# Patient Record
Sex: Female | Born: 1950
Health system: Southern US, Community
[De-identification: ages and names within clinical notes are randomized; demographics above are authoritative.]

## PROBLEM LIST (undated history)

## (undated) DIAGNOSIS — M8580 Other specified disorders of bone density and structure, unspecified site: Secondary | ICD-10-CM

## (undated) DIAGNOSIS — L570 Actinic keratosis: Secondary | ICD-10-CM

## (undated) DIAGNOSIS — K5790 Diverticulosis of intestine, part unspecified, without perforation or abscess without bleeding: Secondary | ICD-10-CM

## (undated) DIAGNOSIS — K649 Unspecified hemorrhoids: Secondary | ICD-10-CM

## (undated) DIAGNOSIS — C4490 Unspecified malignant neoplasm of skin, unspecified: Secondary | ICD-10-CM

## (undated) HISTORY — DX: Diverticulosis of intestine, part unspecified, without perforation or abscess without bleeding: K57.90

## (undated) HISTORY — DX: Actinic keratosis: L57.0

## (undated) HISTORY — DX: Unspecified hemorrhoids: K64.9

## (undated) HISTORY — DX: Other specified disorders of bone density and structure, unspecified site: M85.80

## (undated) HISTORY — DX: Unspecified malignant neoplasm of skin, unspecified: C44.90

---

## 1998-12-30 ENCOUNTER — Ambulatory Visit (HOSPITAL_COMMUNITY): Admission: RE | Admit: 1998-12-30 | Discharge: 1998-12-30 | Payer: Self-pay | Admitting: Family Medicine

## 1999-01-17 ENCOUNTER — Ambulatory Visit (HOSPITAL_COMMUNITY): Admission: RE | Admit: 1999-01-17 | Discharge: 1999-01-17 | Payer: Self-pay | Admitting: Gastroenterology

## 2001-06-12 ENCOUNTER — Other Ambulatory Visit: Admission: RE | Admit: 2001-06-12 | Discharge: 2001-06-12 | Payer: Self-pay | Admitting: Family Medicine

## 2001-08-26 ENCOUNTER — Other Ambulatory Visit: Admission: RE | Admit: 2001-08-26 | Discharge: 2001-08-26 | Payer: Self-pay | Admitting: Family Medicine

## 2004-05-01 ENCOUNTER — Ambulatory Visit (HOSPITAL_COMMUNITY): Admission: RE | Admit: 2004-05-01 | Discharge: 2004-05-01 | Payer: Self-pay | Admitting: Gastroenterology

## 2005-08-05 ENCOUNTER — Emergency Department (HOSPITAL_COMMUNITY): Admission: EM | Admit: 2005-08-05 | Discharge: 2005-08-05 | Payer: Self-pay | Admitting: Emergency Medicine

## 2006-11-13 LAB — HM PAP SMEAR

## 2009-07-22 LAB — HM COLONOSCOPY

## 2010-12-20 DIAGNOSIS — C4490 Unspecified malignant neoplasm of skin, unspecified: Secondary | ICD-10-CM

## 2010-12-20 HISTORY — DX: Unspecified malignant neoplasm of skin, unspecified: C44.90

## 2011-10-01 LAB — HM DEXA SCAN

## 2012-07-20 DIAGNOSIS — L570 Actinic keratosis: Secondary | ICD-10-CM

## 2012-07-20 HISTORY — DX: Actinic keratosis: L57.0

## 2012-10-01 LAB — HM MAMMOGRAPHY

## 2013-09-06 ENCOUNTER — Encounter: Payer: Self-pay | Admitting: Family Medicine

## 2013-09-21 ENCOUNTER — Ambulatory Visit (INDEPENDENT_AMBULATORY_CARE_PROVIDER_SITE_OTHER): Payer: BC Managed Care – PPO | Admitting: Physician Assistant

## 2013-09-21 ENCOUNTER — Encounter: Payer: Self-pay | Admitting: Physician Assistant

## 2013-09-21 VITALS — BP 104/76 | HR 64 | Temp 98.3°F | Resp 18 | Ht 64.5 in | Wt 136.0 lb

## 2013-09-21 DIAGNOSIS — K649 Unspecified hemorrhoids: Secondary | ICD-10-CM | POA: Insufficient documentation

## 2013-09-21 DIAGNOSIS — Z0000 Encounter for general adult medical examination without abnormal findings: Secondary | ICD-10-CM

## 2013-09-21 DIAGNOSIS — M8580 Other specified disorders of bone density and structure, unspecified site: Secondary | ICD-10-CM | POA: Insufficient documentation

## 2013-09-21 DIAGNOSIS — E559 Vitamin D deficiency, unspecified: Secondary | ICD-10-CM

## 2013-09-21 DIAGNOSIS — K5790 Diverticulosis of intestine, part unspecified, without perforation or abscess without bleeding: Secondary | ICD-10-CM | POA: Insufficient documentation

## 2013-09-21 DIAGNOSIS — M899 Disorder of bone, unspecified: Secondary | ICD-10-CM

## 2013-09-21 NOTE — Progress Notes (Signed)
Patient ID: Norma Collins MRN: [id number], DOB: 09/11/1951, 62 y.o. Date of Encounter: 09/21/2013,   Chief Complaint: Physical (CPE)  HPI: 62 y.o. y/o white female  here for CPE.  She is a retired Teacher, early years/pre. She is always very pleasant. She has no complaints today. Has been feeling very well.  She has seen a dermatologist for skin check. Has not needed to see any other medical evaluations.   Review of Systems: Consitutional: No fever, chills, fatigue, night sweats, lymphadenopathy. No significant/unexplained weight changes. Eyes: No visual changes, eye redness, or discharge. ENT/Mouth: No ear pain, sore throat, nasal drainage, or sinus pain. Cardiovascular: No chest pressure,heaviness, tightness or squeezing, even with exertion. No increased shortness of breath or dyspnea on exertion.No palpitations, edema, orthopnea, PND. Respiratory: No cough, hemoptysis, SOB, or wheezing. Gastrointestinal: No anorexia, dysphagia, reflux, pain, nausea, vomiting, hematemesis, diarrhea, constipation, BRBPR, or melena. Breast: No mass, nodules, bulging, or retraction. No skin changes or inflammation. No nipple discharge. No lymphadenopathy. Genitourinary: No dysuria, hematuria, incontinence, vaginal discharge, pruritis, burning, abnormal bleeding, or pain. Musculoskeletal: No decreased ROM, No joint pain or swelling. No significant pain in neck, back, or extremities. Skin: No rash, pruritis, or concerning lesions. Neurological: No headache, dizziness, syncope, seizures, tremors, memory loss, coordination problems, or paresthesias. Psychological: No anxiety, depression, hallucinations, SI/HI. Endocrine: No polydipsia, polyphagia, polyuria, or known diabetes.No increased fatigue. No palpitations/rapid heart rate. No significant/unexplained weight change. All other systems were reviewed and are otherwise negative.  Past Medical History  Diagnosis Date  . Osteopenia   . Diverticulosis   . Hemorrhoids    . Skin cancer 12/20/2010    Squamous Cell In Situ--Right Chest  . Actinic keratosis 07/20/2012    Cryotherapy- Right Forehead     History reviewed. No pertinent past surgical history.  Home Meds:  She takes Os-Cal. Takes over-the-counter vitamin D 1000 units daily. No other medications. Just these 2 pills each day.  Allergies: No Known Allergies  Social history: She has never smoked. She uses no alcohol. Uses no illicit drugs. He is retired. She was a Teacher, early years/pre. She has no children. She is not sexually active. She walks 30 minutes outside every single day. She does yoga videos at home. SHe is involved at church. She likes to read.  Family History  Problem Relation Age of Onset  . Cancer Mother 63    Colon Cancer  . Cancer Father 28    GallBladder Cancer    Physical Exam: Blood pressure 104/76, pulse 64, temperature 98.3 F (36.8 C), temperature source Oral, resp. rate 18, height 5' 4.5" (1.638 m), weight 136 lb (61.689 kg)., Body mass index is 22.99 kg/(m^2). General: Well developed, well nourished,WF Appears in no acute distress. HEENT: Normocephalic, atraumatic. Conjunctiva pink, sclera non-icteric. Pupils 2 mm constricting to 1 mm, round, regular, and equally reactive to light and accomodation. EOMI. Internal auditory canal clear. TMs with good cone of light and without pathology. Nasal mucosa pink. Nares are without discharge. No sinus tenderness. Oral mucosa pink.  Pharynx without exudate.   Neck: Supple. Trachea midline. No thyromegaly. Full ROM. No lymphadenopathy.No Carotid Bruits. Lungs: Clear to auscultation bilaterally without wheezes, rales, or rhonchi. Breathing is of normal effort and unlabored. Cardiovascular: RRR with S1 S2. No murmurs, rubs, or gallops. Distal pulses 2+ symmetrically. No carotid or abdominal bruits. Breast: She defers breast exam. Abdomen: Soft, non-tender, non-distended with normoactive bowel sounds. No hepatosplenomegaly or masses. No  rebound/guarding. No CVA tenderness. No hernias.  Genitourinary: She defers pelvic exam. Musculoskeletal:  Full range of motion and 5/5 strength throughout. Without swelling, atrophy, tenderness, crepitus, or warmth. Extremities without clubbing, cyanosis, or edema. Calves supple. Skin: Warm and moist without erythema, ecchymosis, wounds, or rash. Neuro: A+Ox3. CN II-XII grossly intact. Moves all extremities spontaneously. Full sensation throughout. Normal gait. DTR 2+ throughout upper and lower extremities. Finger to nose intact. Psych:  Responds to questions appropriately with a normal affect.   Assessment/Plan:  62 y.o. y/o female here for CPE 1. Visit for preventive health examination  A. Screening laboratories: These were all performed at her last physical April 2013. All labs were completely normal.  B. Pelvic Exam/ Pap Smear: Patient refuses. She is aware of risk versus benefit but refuses.  C. breast exam: Patient refuses. She is aware of risk versus benefits but still refuses.  D. screening mammogram: She does have this scheduled in the near future and will followup with this. She does go for  these routinely.  E. colorectal cancer screening:  She has a premature family history of colon cancer. Therefore she has a repeat colonoscopy every 5 years.  Last colonoscopy was September 2010. Due to repeat September 2015.  F. immunizations:  DTaP was updated here April 2013. Zostavax: She received this January 2012 Pneumovax: We'll give this a 62   influenza vaccine: She defers.  2. Osteopenia At Her physical exam 03/17/2012 we discussed that she had been on Fosamax for at least 5 years. Therefore Fosamax was stopped at that time. She has continued to be on calcium vitamin D and weightbearing exercise. Vitamin D level was checked April 2013. It was the high end of normal so the dose was decreased from 2000 to 1000 units daily. We discussed today  whether to even get another bone  density test. Discussed that regardless of the findings we would not restart another prescription medication for this. However she is interested just to get a followup to see where her bone density is so we will go ahead with this. - DG Bone Density; Future  3. Vitamin D deficiency On 1000 units daily. Last lab check was April 2013.    [address] [place], [place], [facility] 09/21/2013 4:38 PM

## 2013-10-16 ENCOUNTER — Encounter: Payer: Self-pay | Admitting: Family Medicine

## 2013-10-30 ENCOUNTER — Other Ambulatory Visit: Payer: Self-pay

## 2013-11-25 ENCOUNTER — Telehealth: Payer: Self-pay | Admitting: Family Medicine

## 2013-11-25 NOTE — Telephone Encounter (Signed)
Pt states she never heard from her Dexa Scan in November.  We had new reprot sent to [place].  It was logged in to Epic as done but could not find result.  Pt was Osteopenic on last scan.  History of treatment with Fosamax.  Now on OTC calcium and Vitamin [place] with exercise.  Per provider.  Bone density has improved and patient can continue with current medications and exercise.  Recommend repeat Dexa in 3 years.

## 2014-10-06 LAB — HM MAMMOGRAPHY: HM MAMMO: NORMAL

## 2014-10-11 ENCOUNTER — Encounter: Payer: Self-pay | Admitting: Family Medicine

## 2014-10-13 LAB — HM MAMMOGRAPHY: HM Mammogram: NORMAL

## 2014-11-03 ENCOUNTER — Encounter: Payer: Self-pay | Admitting: Physician Assistant

## 2014-11-05 ENCOUNTER — Encounter: Payer: Self-pay | Admitting: Family Medicine

## 2014-12-08 ENCOUNTER — Other Ambulatory Visit: Payer: BLUE CROSS/BLUE SHIELD

## 2014-12-08 DIAGNOSIS — Z0000 Encounter for general adult medical examination without abnormal findings: Secondary | ICD-10-CM

## 2014-12-08 DIAGNOSIS — E559 Vitamin D deficiency, unspecified: Secondary | ICD-10-CM

## 2014-12-08 LAB — CBC WITH DIFFERENTIAL/PLATELET
Basophils Absolute: 0 10*3/uL (ref 0.0–0.1)
Basophils Relative: 1 % (ref 0–1)
EOS ABS: 0.1 10*3/uL (ref 0.0–0.7)
EOS PCT: 2 % (ref 0–5)
HCT: 43.1 % (ref 36.0–46.0)
Hemoglobin: 14.9 g/dL (ref 12.0–15.0)
LYMPHS ABS: 1.5 10*3/uL (ref 0.7–4.0)
Lymphocytes Relative: 36 % (ref 12–46)
MCH: 31.8 pg (ref 26.0–34.0)
MCHC: 34.6 g/dL (ref 30.0–36.0)
MCV: 92.1 fL (ref 78.0–100.0)
MONO ABS: 0.4 10*3/uL (ref 0.1–1.0)
MPV: 9.1 fL (ref 8.6–12.4)
Monocytes Relative: 10 % (ref 3–12)
Neutro Abs: 2.2 10*3/uL (ref 1.7–7.7)
Neutrophils Relative %: 51 % (ref 43–77)
Platelets: 185 10*3/uL (ref 150–400)
RBC: 4.68 MIL/uL (ref 3.87–5.11)
RDW: 14.3 % (ref 11.5–15.5)
WBC: 4.3 10*3/uL (ref 4.0–10.5)

## 2014-12-08 LAB — COMPLETE METABOLIC PANEL WITH GFR
ALBUMIN: 4.1 g/dL (ref 3.5–5.2)
ALK PHOS: 68 U/L (ref 39–117)
ALT: 20 U/L (ref 0–35)
AST: 18 U/L (ref 0–37)
BUN: 13 mg/dL (ref 6–23)
CO2: 27 mEq/L (ref 19–32)
Calcium: 9.6 mg/dL (ref 8.4–10.5)
Chloride: 107 mEq/L (ref 96–112)
Creat: 0.77 mg/dL (ref 0.50–1.10)
GFR, Est African American: >89 mL/min
GFR, Est Non African American: 82 mL/min
Glucose, Bld: 97 mg/dL (ref 70–99)
Potassium: 4.9 mEq/L (ref 3.5–5.3)
Sodium: 142 mEq/L (ref 135–145)
Total Bilirubin: 0.3 mg/dL (ref 0.2–1.2)
Total Protein: 7.5 g/dL (ref 6.0–8.3)

## 2014-12-08 LAB — LIPID PANEL
Cholesterol: 149 mg/dL (ref 0–200)
HDL: 60 mg/dL (ref >39)
LDL Cholesterol: 70 mg/dL (ref 0–99)
TRIGLYCERIDES: 97 mg/dL (ref <150)
Total CHOL/HDL Ratio: 2.5 Ratio
VLDL: 19 mg/dL (ref 0–40)

## 2014-12-08 LAB — TSH: TSH: 2.896 u[IU]/mL (ref 0.350–4.500)

## 2014-12-09 LAB — VITAMIN D 25 HYDROXY (VIT D DEFICIENCY, FRACTURES): Vit D, 25-Hydroxy: 62 ng/mL (ref 30–100)

## 2014-12-10 ENCOUNTER — Other Ambulatory Visit: Payer: Self-pay

## 2014-12-13 ENCOUNTER — Encounter: Payer: Self-pay | Admitting: Physician Assistant

## 2014-12-15 ENCOUNTER — Ambulatory Visit (INDEPENDENT_AMBULATORY_CARE_PROVIDER_SITE_OTHER): Payer: BLUE CROSS/BLUE SHIELD | Admitting: Physician Assistant

## 2014-12-15 ENCOUNTER — Encounter: Payer: Self-pay | Admitting: Physician Assistant

## 2014-12-15 VITALS — BP 114/78 | HR 72 | Temp 97.8°F | Resp 18 | Ht 64.25 in | Wt 146.0 lb

## 2014-12-15 DIAGNOSIS — Z0000 Encounter for general adult medical examination without abnormal findings: Secondary | ICD-10-CM

## 2014-12-15 NOTE — Progress Notes (Signed)
Patient ID: Nakyra Bourn MRN: [id number], DOB: [birth year], 64 y.o. Date of Encounter: 12/15/2014,   Chief Complaint: Physical (CPE)  HPI: 64 y.o. y/o white female  here for CPE.  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 She has no complaints today. Has been feeling very well.  She has seen a dermatologist for skin check. Goes there annually.  Has not needed to see any other medical evaluations.   Review of Systems: Consitutional: No fever, chills, fatigue, night sweats, lymphadenopathy. No significant/unexplained weight changes. Eyes: No visual changes, eye redness, or discharge. ENT/Mouth: No ear pain, sore throat, nasal drainage, or sinus pain. Cardiovascular: No chest pressure,heaviness, tightness or squeezing, even with exertion. No increased shortness of breath or dyspnea on exertion.No palpitations, edema, orthopnea, PND. Respiratory: No cough, hemoptysis, SOB, or wheezing. Gastrointestinal: No anorexia, dysphagia, reflux, pain, nausea, vomiting, hematemesis, diarrhea, constipation, BRBPR, or melena. Breast: No mass, nodules, bulging, or retraction. No skin changes or inflammation. No nipple discharge. No lymphadenopathy. Genitourinary: No dysuria, hematuria, incontinence, vaginal discharge, pruritis, burning, abnormal bleeding, or pain. Musculoskeletal: No decreased ROM, No joint pain or swelling. No significant pain in neck, back, or extremities. Skin: No rash, pruritis, or concerning lesions. Neurological: No headache, dizziness, syncope, seizures, tremors, memory loss, coordination problems, or paresthesias. Psychological: No anxiety, depression, hallucinations, SI/HI. Endocrine: No polydipsia, polyphagia, polyuria, or known diabetes.No increased fatigue. No palpitations/rapid heart rate. No significant/unexplained weight change. All other systems were reviewed and are otherwise negative.  Past Medical History  Diagnosis Date  . Osteopenia   .  Diverticulosis   . Hemorrhoids   . Skin cancer 12/20/2010    Squamous Cell In Situ--Right Chest  . Actinic keratosis 07/20/2012    Cryotherapy- Right Forehead     History reviewed. No pertinent past surgical history.  Home Meds:  Outpatient Prescriptions Prior to Visit  Medication Sig Dispense Refill  . calcium carbonate (OS-CAL) 600 MG TABS tablet Take 600 mg by mouth 2 (two) times daily with a meal.    . cholecalciferol (VITAMIN D) 1000 UNITS tablet Take 1,000 Units by mouth daily.     No facility-administered medications prior to visit.     Allergies: No Known Allergies  Social history: She has never smoked. She uses no alcohol. Uses no illicit drugs. 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 She is not sexually active. She walks 30 minutes outside every single day. She does yoga videos at home. SHe is involved at church. She likes to read.  Family History  Problem Relation Age of Onset  . Cancer Mother 61    Colon Cancer  . Cancer Father 26    GallBladder Cancer    Physical Exam: Blood pressure 114/78, pulse 72, temperature 97.8 F (36.6 C), temperature source Oral, resp. rate 18, height 5' 4.25" (1.632 m), weight 146 lb (66.225 kg)., Body mass index is 24.86 kg/(m^2). General: Well developed, well nourished,WF Appears in no acute distress. HEENT: Normocephalic, atraumatic. Conjunctiva pink, sclera non-icteric. Pupils 2 mm constricting to 1 mm, round, regular, and equally reactive to light and accomodation. EOMI. Internal auditory canal clear. TMs with good cone of light and without pathology. Nasal mucosa pink. Nares are without discharge. No sinus tenderness. Oral mucosa pink.  Pharynx without exudate.   Neck: Supple. Trachea midline. No thyromegaly. Full ROM. No lymphadenopathy.No Carotid Bruits. Lungs: Clear to auscultation bilaterally without wheezes, rales, or rhonchi. Breathing is of normal effort and unlabored. Cardiovascular: RRR with S1 S2. No  murmurs, rubs, or gallops. Distal pulses 2+ symmetrically. No carotid or abdominal bruits. Breast: She defers breast exam. Abdomen: Soft, non-tender, non-distended with normoactive bowel sounds. No hepatosplenomegaly or masses. No rebound/guarding. No CVA tenderness. No hernias.  Genitourinary: She defers pelvic exam. Musculoskeletal: Full range of motion and 5/5 strength throughout. Without swelling, atrophy, tenderness, crepitus, or warmth. Extremities without clubbing, cyanosis, or edema. Calves supple. Skin: Warm and moist without erythema, ecchymosis, wounds, or rash. Neuro: A+Ox3. CN II-XII grossly intact. Moves all extremities spontaneously. Full sensation throughout. Normal gait. DTR 2+ throughout upper and lower extremities. Finger to nose intact. Psych:  Responds to questions appropriately with a normal affect.   Assessment/Plan:  64 y.o. y/o female here for CPE 1. Visit for preventive health examination  A. Screening laboratories: She recently came and had fasting labs drawn on 12/08/2014. All labs are normal and lipid panel excellent. CBC normal CMP T normal Lipid panel all parameters are excellent TSH normal Vitamin D normal at 62.  B. Pelvic Exam/ Pap Smear: Patient refuses. She is aware of risk versus benefit but refuses.  C. breast exam: Patient refuses. She is aware of risk versus benefits but still refuses.  D. screening mammogram: She had this performed 10/06/2014. He says that they have to do further evaluation because her tissue was "pinched up ". Follow-up test was on 10/13/14 and was normal.  E. colorectal cancer screening:  She has a premature family history of colon cancer. Therefore she has a repeat colonoscopy every 5 years.  Last colonoscopy was September 2010. Due to repeat September 2015. She states that she has follow-up colonoscopy scheduled with Dr. Watt Climes on Monday which that date is 12/20/2014.  F. immunizations:  DTaP was updated here April  2013. Zostavax: She received this January 2012 Pneumovax: We'll give this a 64   influenza vaccine: She defers.  2. Osteopenia At Her physical exam 03/17/2012 we discussed that she had been on Fosamax for at least 5 years. Therefore Fosamax was stopped at that time. She has continued to be on calcium vitamin D and weightbearing exercise. Vitamin D level was checked April 2013. It was the high end of normal so the dose was decreased from 2000 to 1000 units daily. At her CPE 2015 we discussed  whether to even get another bone density test. Discussed that regardless of the findings we would not restart another prescription medication for this. However she was interested just to get a followup to see where her bone density is so we planned to go ahead with this. This Was performed and she says we told her that it looked good. He says that she will continue her calcium vitamin D and exercise and does not need to repeat this again in the future.   3. Vitamin D deficiency On 1000 units daily. Vitamin D level is 62 on labs 12/08/2014. This is excellent. Continue current vitamin D dose.   Marin Olp [place], [place], [facility] 12/15/2014 3:24 PM

## 2014-12-20 LAB — HM COLONOSCOPY

## 2015-01-11 ENCOUNTER — Encounter: Payer: Self-pay | Admitting: Family Medicine

## 2015-10-19 LAB — HM MAMMOGRAPHY: HM Mammogram: NEGATIVE

## 2015-10-20 ENCOUNTER — Encounter: Payer: Self-pay | Admitting: Family Medicine

## 2016-01-30 ENCOUNTER — Other Ambulatory Visit: Payer: Self-pay | Admitting: Family Medicine

## 2016-01-30 ENCOUNTER — Other Ambulatory Visit: Payer: PPO

## 2016-01-30 ENCOUNTER — Other Ambulatory Visit: Payer: Self-pay | Admitting: Physician Assistant

## 2016-01-30 DIAGNOSIS — Z0000 Encounter for general adult medical examination without abnormal findings: Secondary | ICD-10-CM | POA: Diagnosis not present

## 2016-01-30 DIAGNOSIS — M8580 Other specified disorders of bone density and structure, unspecified site: Secondary | ICD-10-CM | POA: Diagnosis not present

## 2016-01-30 DIAGNOSIS — Z1329 Encounter for screening for other suspected endocrine disorder: Secondary | ICD-10-CM | POA: Diagnosis not present

## 2016-01-30 DIAGNOSIS — Z13220 Encounter for screening for lipoid disorders: Secondary | ICD-10-CM | POA: Diagnosis not present

## 2016-01-30 LAB — COMPLETE METABOLIC PANEL WITH GFR
ALBUMIN: 4 g/dL (ref 3.6–5.1)
ALT: 19 U/L (ref 6–29)
AST: 13 U/L (ref 10–35)
Alkaline Phosphatase: 65 U/L (ref 33–130)
BILIRUBIN TOTAL: 0.4 mg/dL (ref 0.2–1.2)
BUN: 14 mg/dL (ref 7–25)
CALCIUM: 9 mg/dL (ref 8.6–10.4)
CHLORIDE: 108 mmol/L (ref 98–110)
CO2: 27 mmol/L (ref 20–31)
CREATININE: 0.63 mg/dL (ref 0.50–0.99)
GFR, Est Non African American: >89 mL/min (ref >=60)
Glucose, Bld: 90 mg/dL (ref 70–99)
Potassium: 4.2 mmol/L (ref 3.5–5.3)
Sodium: 142 mmol/L (ref 135–146)
TOTAL PROTEIN: 6.8 g/dL (ref 6.1–8.1)

## 2016-01-30 LAB — CBC WITH DIFFERENTIAL/PLATELET
BASOS ABS: 0 10*3/uL (ref 0.0–0.1)
Basophils Relative: 0 % (ref 0–1)
EOS ABS: 0.1 10*3/uL (ref 0.0–0.7)
EOS PCT: 2 % (ref 0–5)
HEMATOCRIT: 41.8 % (ref 36.0–46.0)
Hemoglobin: 14.2 g/dL (ref 12.0–15.0)
LYMPHS ABS: 1.3 10*3/uL (ref 0.7–4.0)
LYMPHS PCT: 36 % (ref 12–46)
MCH: 31.8 pg (ref 26.0–34.0)
MCHC: 34 g/dL (ref 30.0–36.0)
MCV: 93.5 fL (ref 78.0–100.0)
MPV: 8.8 fL (ref 8.6–12.4)
Monocytes Absolute: 0.4 10*3/uL (ref 0.1–1.0)
Monocytes Relative: 10 % (ref 3–12)
NEUTROS PCT: 52 % (ref 43–77)
Neutro Abs: 1.9 10*3/uL (ref 1.7–7.7)
PLATELETS: 173 10*3/uL (ref 150–400)
RBC: 4.47 MIL/uL (ref 3.87–5.11)
RDW: 14.3 % (ref 11.5–15.5)
WBC: 3.7 10*3/uL — AB (ref 4.0–10.5)

## 2016-01-30 LAB — LIPID PANEL
CHOL/HDL RATIO: 2.6 ratio (ref <=5.0)
CHOLESTEROL: 153 mg/dL (ref 125–200)
HDL: 58 mg/dL (ref >=46)
LDL Cholesterol: 75 mg/dL (ref <130)
Triglycerides: 98 mg/dL (ref <150)
VLDL: 20 mg/dL (ref <30)

## 2016-01-30 LAB — TSH: TSH: 2.37 m[IU]/L

## 2016-02-01 ENCOUNTER — Ambulatory Visit (INDEPENDENT_AMBULATORY_CARE_PROVIDER_SITE_OTHER): Payer: PPO | Admitting: Physician Assistant

## 2016-02-01 ENCOUNTER — Encounter: Payer: Self-pay | Admitting: Physician Assistant

## 2016-02-01 VITALS — BP 122/76 | HR 76 | Temp 98.0°F | Resp 18 | Ht 64.5 in | Wt 147.0 lb

## 2016-02-01 DIAGNOSIS — Z0000 Encounter for general adult medical examination without abnormal findings: Secondary | ICD-10-CM | POA: Diagnosis not present

## 2016-02-01 NOTE — Progress Notes (Signed)
Patient ID: Norma Collins MRN: [id number], DOB: 03-04-1951, 65 y.o. Date of Encounter: 02/01/2016,   Chief Complaint: Physical (CPE)  HPI: 65 y.o. y/o white female  here for CPE.  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 She has no complaints today. Has been feeling very well.  She has seen a dermatologist for skin check. Goes there annually.  Has not needed to see any other medical evaluations.   Review of Systems: Consitutional: No fever, chills, fatigue, night sweats, lymphadenopathy. No significant/unexplained weight changes. Eyes: No visual changes, eye redness, or discharge. ENT/Mouth: No ear pain, sore throat, nasal drainage, or sinus pain. Cardiovascular: No chest pressure,heaviness, tightness or squeezing, even with exertion. No increased shortness of breath or dyspnea on exertion.No palpitations, edema, orthopnea, PND. Respiratory: No cough, hemoptysis, SOB, or wheezing. Gastrointestinal: No anorexia, dysphagia, reflux, pain, nausea, vomiting, hematemesis, diarrhea, constipation, BRBPR, or melena. Breast: No mass, nodules, bulging, or retraction. No skin changes or inflammation. No nipple discharge. No lymphadenopathy. Genitourinary: No dysuria, hematuria, incontinence, vaginal discharge, pruritis, burning, abnormal bleeding, or pain. Musculoskeletal: No decreased ROM, No joint pain or swelling. No significant pain in neck, back, or extremities. Skin: No rash, pruritis, or concerning lesions. Neurological: No headache, dizziness, syncope, seizures, tremors, memory loss, coordination problems, or paresthesias. Psychological: No anxiety, depression, hallucinations, SI/HI. Endocrine: No polydipsia, polyphagia, polyuria, or known diabetes.No increased fatigue. No palpitations/rapid heart rate. No significant/unexplained weight change. All other systems were reviewed and are otherwise negative.  Past Medical History  Diagnosis Date  . Osteopenia   .  Diverticulosis   . Hemorrhoids   . Skin cancer 12/20/2010    Squamous Cell In Situ--Right Chest  . Actinic keratosis 07/20/2012    Cryotherapy- Right Forehead     History reviewed. No pertinent past surgical history.  Home Meds:  Outpatient Prescriptions Prior to Visit  Medication Sig Dispense Refill  . calcium carbonate (OS-CAL) 600 MG TABS tablet Take 600 mg by mouth 2 (two) times daily with a meal.    . cholecalciferol (VITAMIN D) 1000 UNITS tablet Take 1,000 Units by mouth daily.    Marland Kitchen GAVILYTE-N WITH FLAVOR PACK 420 G solution   0   No facility-administered medications prior to visit.     Allergies: No Known Allergies  Social history: She has never smoked. She uses no alcohol. Uses no illicit drugs. 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 She is not sexually active. She walks 30 minutes outside every single day. She does yoga videos at home. SHe is involved at church. She likes to read.  Family History  Problem Relation Age of Onset  . Cancer Mother 67    Colon Cancer  . Cancer Father 62    GallBladder Cancer    Physical Exam: Blood pressure 122/76, pulse 76, temperature 98 F (36.7 C), temperature source Oral, resp. rate 18, height 5' 4.5" (1.638 m), weight 147 lb (66.679 kg)., Body mass index is 24.85 kg/(m^2). General: Well developed, well nourished,WF Appears in no acute distress. HEENT: Normocephalic, atraumatic. Conjunctiva pink, sclera non-icteric. Pupils 2 mm constricting to 1 mm, round, regular, and equally reactive to light and accomodation. EOMI. Internal auditory canal clear. TMs with good cone of light and without pathology. Nasal mucosa pink. Nares are without discharge. No sinus tenderness. Oral mucosa pink.  Pharynx without exudate.   Neck: Supple. Trachea midline. No thyromegaly. Full ROM. No lymphadenopathy.No Carotid Bruits. Lungs: Clear to auscultation bilaterally without wheezes, rales, or rhonchi. Breathing  is of normal effort and  unlabored. Cardiovascular: RRR with S1 S2. No murmurs, rubs, or gallops. Distal pulses 2+ symmetrically. No carotid or abdominal bruits. Breast: She defers breast exam. Abdomen: Soft, non-tender, non-distended with normoactive bowel sounds. No hepatosplenomegaly or masses. No rebound/guarding. No CVA tenderness. No hernias.  Genitourinary: She defers pelvic exam. Musculoskeletal: Full range of motion and 5/5 strength throughout. Without swelling, atrophy, tenderness, crepitus, or warmth. Extremities without clubbing, cyanosis, or edema. Calves supple. Skin: Warm and moist without erythema, ecchymosis, wounds, or rash. Neuro: A+Ox3. CN II-XII grossly intact. Moves all extremities spontaneously. Full sensation throughout. Normal gait. DTR 2+ throughout upper and lower extremities. Finger to nose intact. Psych:  Responds to questions appropriately with a normal affect.   Assessment/Plan:  65 y.o. y/o female here for CPE 1. Visit for preventive health examination  A. Screening laboratories: She recently came and had fasting labs drawn. All labs are normal and lipid panel excellent. CBC normal CMP T normal Lipid panel all parameters are excellent TSH normal Vitamin D --level was not checked --will have lab add this.  B. Pelvic Exam/ Pap Smear: Patient refuses. She is aware of risk versus benefit but refuses.  C. breast exam: Patient refuses. She is aware of risk versus benefits but still refuses.  D. screening mammogram: She had this performed 09/2015. She reports this was normal.   E. colorectal cancer screening:  She has a premature family history of colon cancer. Therefore she has a repeat colonoscopy every 5 years.  Last colonoscopy was September 2010. Due to repeat September 2015. She had follow-up colonoscopy with Dr. Watt Climes on 12/20/2014.  F. Immunizations:  DTaP was updated here April 2013. Zostavax: She received this January 2012 Pneumovax: Will give this a 65   influenza  vaccine: She defers.  2. Osteopenia At Her physical exam 03/17/2012 we discussed that she had been on Fosamax for at least 5 years. Therefore Fosamax was stopped at that time. She has continued to be on calcium vitamin D and weightbearing exercise. Vitamin D level was checked April 2013. It was the high end of normal so the dose was decreased from 2000 to 1000 units daily. At her CPE 2015 we discussed  whether to even get another bone density test. Discussed that regardless of the findings we would not restart another prescription medication for this. However she was interested just to get a followup to see where her bone density is so we planned to go ahead with this. This Was performed and she says we told her that it looked good. He says that she will continue her calcium vitamin D and exercise and does not need to repeat this again in the future.   3. Vitamin D deficiency On 1000 units daily. Vitamin D level is 62 on labs 12/08/2014. This is excellent. Continue current vitamin D dose. 01/2016--vitamin D level was not checked on labs that this level of the added to the blood drawn to follow-up.   Signed, [address] [place], [place], [facility] 02/01/2016 11:46 AM

## 2016-02-02 LAB — VITAMIN D 25 HYDROXY (VIT D DEFICIENCY, FRACTURES): VIT D 25 HYDROXY: 61 ng/mL (ref 30–100)

## 2016-03-28 DIAGNOSIS — I87323 Chronic venous hypertension (idiopathic) with inflammation of bilateral lower extremity: Secondary | ICD-10-CM | POA: Diagnosis not present

## 2016-07-09 DIAGNOSIS — H2513 Age-related nuclear cataract, bilateral: Secondary | ICD-10-CM | POA: Diagnosis not present

## 2016-08-21 DIAGNOSIS — D225 Melanocytic nevi of trunk: Secondary | ICD-10-CM | POA: Diagnosis not present

## 2016-08-21 DIAGNOSIS — D1801 Hemangioma of skin and subcutaneous tissue: Secondary | ICD-10-CM | POA: Diagnosis not present

## 2016-08-21 DIAGNOSIS — L812 Freckles: Secondary | ICD-10-CM | POA: Diagnosis not present

## 2016-08-21 DIAGNOSIS — Z85828 Personal history of other malignant neoplasm of skin: Secondary | ICD-10-CM | POA: Diagnosis not present

## 2016-08-21 DIAGNOSIS — L570 Actinic keratosis: Secondary | ICD-10-CM | POA: Diagnosis not present

## 2016-08-21 DIAGNOSIS — L821 Other seborrheic keratosis: Secondary | ICD-10-CM | POA: Diagnosis not present

## 2016-09-14 ENCOUNTER — Other Ambulatory Visit: Payer: Self-pay | Admitting: Physician Assistant

## 2016-09-14 DIAGNOSIS — Z1231 Encounter for screening mammogram for malignant neoplasm of breast: Secondary | ICD-10-CM

## 2016-10-19 ENCOUNTER — Ambulatory Visit
Admission: RE | Admit: 2016-10-19 | Discharge: 2016-10-19 | Disposition: A | Discharge status: Home / Self Care | Payer: Self-pay | Source: Ambulatory Visit | Attending: Physician Assistant | Admitting: Physician Assistant

## 2016-10-19 DIAGNOSIS — Z1231 Encounter for screening mammogram for malignant neoplasm of breast: Secondary | ICD-10-CM | POA: Diagnosis not present

## 2017-04-10 DIAGNOSIS — I8311 Varicose veins of right lower extremity with inflammation: Secondary | ICD-10-CM | POA: Diagnosis not present

## 2017-05-09 ENCOUNTER — Ambulatory Visit (INDEPENDENT_AMBULATORY_CARE_PROVIDER_SITE_OTHER): Payer: PPO | Admitting: Physician Assistant

## 2017-05-09 ENCOUNTER — Encounter: Payer: Self-pay | Admitting: Physician Assistant

## 2017-05-09 VITALS — BP 104/70 | HR 87 | Temp 98.1°F | Resp 18 | Ht 63.5 in | Wt 152.2 lb

## 2017-05-09 DIAGNOSIS — Z0000 Encounter for general adult medical examination without abnormal findings: Secondary | ICD-10-CM | POA: Diagnosis not present

## 2017-05-09 DIAGNOSIS — M21619 Bunion of unspecified foot: Secondary | ICD-10-CM

## 2017-05-09 DIAGNOSIS — M79674 Pain in right toe(s): Secondary | ICD-10-CM

## 2017-05-09 NOTE — Progress Notes (Signed)
Patient ID: Norma Collins MRN: [id number], DOB: 1951-09-28, 66 y.o. Date of Encounter: 05/09/2017,   Chief Complaint: Physical (CPE)  HPI: 66 y.o. y/o white female  here for CPE.  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 She has no complaints today. Has been feeling very well.  She has seen a dermatologist for skin check. Goes there annually.  Has not needed to see any other medical evaluations. She also sees the dentist routinely and also has routine eye exams.  05/09/2017: States that she has a niece getting married saying that she has been back and forth to [place] quite a bit for wedding showers etc. Says that otherwise she is just been staying busy with things around the house and volunteering etc. She states that there is one thing she wanted to discuss with me. He is noticing some pain in right second toe. Does have some mild bunions and doesn't know if that is contributing to the pain or what is causing it. As recently started going to the new fitness center located [place] [address] and walks on the treadmill there and is noticing discomfort in that second toe when she does this walking. No other concerns to address.  Review of Systems: Consitutional: No fever, chills, fatigue, night sweats, lymphadenopathy. No significant/unexplained weight changes. Eyes: No visual changes, eye redness, or discharge. ENT/Mouth: No ear pain, sore throat, nasal drainage, or sinus pain. Cardiovascular: No chest pressure,heaviness, tightness or squeezing, even with exertion. No increased shortness of breath or dyspnea on exertion.No palpitations, edema, orthopnea, PND. Respiratory: No cough, hemoptysis, SOB, or wheezing. Gastrointestinal: No anorexia, dysphagia, reflux, pain, nausea, vomiting, hematemesis, diarrhea, constipation, BRBPR, or melena. Breast: No mass, nodules, bulging, or retraction. No skin changes or inflammation. No nipple discharge. No  lymphadenopathy. Genitourinary: No dysuria, hematuria, incontinence, vaginal discharge, pruritis, burning, abnormal bleeding, or pain. Musculoskeletal: No decreased ROM. No significant pain in neck, back. See HPI---05/09/2017 Skin: No rash, pruritis, or concerning lesions. Neurological: No headache, dizziness, syncope, seizures, tremors, memory loss, coordination problems, or paresthesias. Psychological: No anxiety, depression, hallucinations, SI/HI. Endocrine: No polydipsia, polyphagia, polyuria, or known diabetes.No increased fatigue. No palpitations/rapid heart rate. No significant/unexplained weight change. All other systems were reviewed and are otherwise negative.  Past Medical History:  Diagnosis Date  . Actinic keratosis 07/20/2012   Cryotherapy- Right Forehead  . Diverticulosis   . Hemorrhoids   . Osteopenia   . Skin cancer 12/20/2010   Squamous Cell In Situ--Right Chest     No past surgical history on file.  Home Meds:  Outpatient Medications Prior to Visit  Medication Sig Dispense Refill  . calcium carbonate (OS-CAL) 600 MG TABS tablet Take 600 mg by mouth 2 (two) times daily with a meal.    . cholecalciferol (VITAMIN D) 1000 UNITS tablet Take 1,000 Units by mouth daily.     No facility-administered medications prior to visit.      Allergies: No Known Allergies  Social history: She has never smoked. She uses no alcohol. Uses no illicit drugs. She is retired. She was a Software engineer. She has no children. She is not sexually active. She walks 30 minutes outside every single day. She does yoga videos at home. She is involved at church. She likes to read.  Family History  Problem Relation Age of Onset  . Cancer Mother 86       Colon Cancer  . Cancer Father 43       GallBladder Cancer    Physical  Exam: Blood pressure 104/70, pulse 87, temperature 98.1 F (36.7 C), temperature source Oral, resp. rate 18, height 5' 3.5" (1.613 m), weight 152 lb 3.2 oz (69 kg), SpO2 98  %., Body mass index is 26.54 kg/m. General: Well developed, well nourished,WF Appears in no acute distress. HEENT: Normocephalic, atraumatic. Conjunctiva pink, sclera non-icteric. Pupils 2 mm constricting to 1 mm, round, regular, and equally reactive to light and accomodation. EOMI. Internal auditory canal clear. TMs with good cone of light and without pathology. Nasal mucosa pink. Nares are without discharge. No sinus tenderness. Oral mucosa pink.  Pharynx without exudate.   Neck: Supple. Trachea midline. No thyromegaly. Full ROM. No lymphadenopathy.No Carotid Bruits. Lungs: Clear to auscultation bilaterally without wheezes, rales, or rhonchi. Breathing is of normal effort and unlabored. Cardiovascular: RRR with S1 S2. No murmurs, rubs, or gallops. Distal pulses 2+ symmetrically. No carotid or abdominal bruits. Breast: She defers breast exam. Abdomen: Soft, non-tender, non-distended with normoactive bowel sounds. No hepatosplenomegaly or masses. No rebound/guarding. No CVA tenderness. No hernias.  Genitourinary: She defers pelvic exam. Musculoskeletal: Full range of motion and 5/5 strength throughout. Without swelling, atrophy, tenderness, crepitus, or warmth. Extremities without clubbing, cyanosis, or edema. Calves supple. Skin: Warm and moist without erythema, ecchymosis, wounds, or rash. Neuro: A+Ox3. CN II-XII grossly intact. Moves all extremities spontaneously. Full sensation throughout. Normal gait.  Psych:  Responds to questions appropriately with a normal affect.   Assessment/Plan:  66 y.o. y/o female here for CPE  1. Visit for preventive health examination  A. Screening laboratories: She is not fasting today. She can/will return fasting for labs.  B. Pelvic Exam/ Pap Smear: Patient refuses. She is aware of risk versus benefit but refuses.  C. breast exam: Patient refuses. She is aware of risk versus benefits but still refuses.  D. screening mammogram: She had this performed  10/2016--This was normal.   E. colorectal cancer screening:  She has a premature family history of colon cancer. Therefore she has a repeat colonoscopy every 5 years.  Last colonoscopy was September 2010. Due to repeat September 2015. She had follow-up colonoscopy with Dr. Watt Climes on 12/20/2014.  F. Immunizations:  DTaP was updated here April 2013. Zostavax: She received this January 2012 Pneumovax: NEED TO DISCUSS AT NEXT OV  influenza vaccine: She defers.  2. Osteopenia At Her physical exam 03/17/2012 we discussed that she had been on Fosamax for at least 5 years. Therefore Fosamax was stopped at that time. She has continued to be on calcium vitamin D and weightbearing exercise. Vitamin D level was checked April 2013. It was the high end of normal so the dose was decreased from 2000 to 1000 units daily. At her CPE 2015 we discussed  whether to even get another bone density test. Discussed that regardless of the findings we would not restart another prescription medication for this. However she was interested just to get a followup to see where her bone density is so we planned to go ahead with this. This Was performed and she says we told her that it looked good. He says that she will continue her calcium vitamin D and exercise and does not need to repeat this again in the future. 05/09/2017---Will Recheck Vit D level---Cont Calcium, Vit D  3. Vitamin D deficiency On 1000 units daily. Vitamin D level is 62 on labs 12/08/2014. This is excellent. Continue current vitamin D dose. 01/2016--vitamin D level was not checked on labs that this level of the added to the blood drawn  to follow-up. 05/09/2017---Will Recheck Vit D level  Subjective:   Patient presents for Medicare Annual/Subsequent preventive examination.   Review Past Medical/Family/Social: This information was reviewed and updated today.  Risk Factors  Current exercise habits: She has recently started going to the new fitness center  on [address] and walks on the treadmill and does some light weights. Dietary issues discussed: She eats a very healthy diet. Low-sodium low saturated fat low carbohydrate.  Cardiac risk factors: Age  Depression Screen  (Note: if answer to either of the following is "Yes", a more complete depression screening is indicated)  Over the past two weeks, have you felt down, depressed or hopeless? No Over the past two weeks, have you felt little interest or pleasure in doing things? No Have you lost interest or pleasure in daily life? No Do you often feel hopeless? No Do you cry easily over simple problems? No   Activities of Daily Living  In your present state of health, do you have any difficulty performing the following activities?:  Driving? No  Managing money? No  Feeding yourself? No  Getting from bed to chair? No  Climbing a flight of stairs? No  Preparing food and eating?: No  Bathing or showering? No  Getting dressed: No  Getting to the toilet? No  Using the toilet:No  Moving around from place to place: No  In the past year have you fallen or had a near fall?:No  Are you sexually active? No  Do you have more than one partner? No   Hearing Difficulties: No  Do you often ask people to speak up or repeat themselves? No  Do you experience ringing or noises in your ears? No Do you have difficulty understanding soft or whispered voices? No  Do you feel that you have a problem with memory? No Do you often misplace items? No  Do you feel safe at home? Yes  Cognitive Testing  Alert? Yes Normal Appearance?Yes  Oriented to person? Yes Place? Yes  Time? Yes  Recall of three objects? Yes  Can perform simple calculations? Yes  Displays appropriate judgment?Yes  Can read the correct time from a watch face?Yes   List the Names of Other Physician/Practitioners you currently use:  She sees dermatologist annually, dentist routinely, and has routine eye exams. He sees no other medical  providers in the past year.  Indicate any recent Medical Services you may have received from other than [facility] providers in the past year (date may be approximate).   Screening Tests / Date-----all of this information is documented above. See note above. Colonoscopy                     Zostavax  Mammogram  Influenza Vaccine  Tetanus/tdap    Assessment:    Annual wellness medicare exam   Plan:    During the course of the visit the patient was educated and counseled about appropriate screening and preventive services including:  Screening mammography  Colorectal cancer screening  Shingles vaccine. Prescription given to that she can get the vaccine at the pharmacy or Medicare part D.  Screen + for depression. PHQ- 9 score of 12 (moderate depression). We discussed the options of counseling versus possibly a medication. I encouraged her strongly think about the counseling. She is going through some medical problems currently and her husband is as well Mrs. been very stressful for her. She says she will think about it. She does have Xanax to use as  needed. Though she may benefit from an SSRI for her more depressive type symptoms but she wants to hold off at this time.  I aksed her to please have her cardioloist send records since we have none on file.  Diet review for nutrition referral? Yes ____ Not Indicated __x__  Patient Instructions (the written plan) was given to the patient.  Medicare Attestation  I have personally reviewed:  The patient's medical and social history  Their use of alcohol, tobacco or illicit drugs  Their current medications and supplements  The patient's functional ability including ADLs,fall risks, home safety risks, cognitive, and hearing and visual impairment  Diet and physical activities  Evidence for depression or mood disorders  The patient's weight, height, BMI, and visual acuity have been recorded in the chart. I have made referrals, counseling, and provided  education to the patient based on review of the above and I have provided the patient with a written personalized care plan for preventive services.       Signed, [address] [place], [place], [facility] 05/09/2017 9:55 AM

## 2017-05-10 ENCOUNTER — Other Ambulatory Visit: Payer: PPO

## 2017-05-10 DIAGNOSIS — Z0000 Encounter for general adult medical examination without abnormal findings: Secondary | ICD-10-CM

## 2017-05-10 LAB — COMPLETE METABOLIC PANEL WITH GFR
ALK PHOS: 72 U/L (ref 33–130)
ALT: 16 U/L (ref 6–29)
AST: 12 U/L (ref 10–35)
Albumin: 4.2 g/dL (ref 3.6–5.1)
BILIRUBIN TOTAL: 0.4 mg/dL (ref 0.2–1.2)
BUN: 12 mg/dL (ref 7–25)
CO2: 21 mmol/L (ref 20–31)
Calcium: 9.2 mg/dL (ref 8.6–10.4)
Chloride: 109 mmol/L (ref 98–110)
Creat: 0.71 mg/dL (ref 0.50–0.99)
GFR, EST NON AFRICAN AMERICAN: 89 mL/min (ref >=60)
Glucose, Bld: 89 mg/dL (ref 70–99)
POTASSIUM: 4 mmol/L (ref 3.5–5.3)
Sodium: 141 mmol/L (ref 135–146)
TOTAL PROTEIN: 6.8 g/dL (ref 6.1–8.1)

## 2017-05-10 LAB — CBC WITH DIFFERENTIAL/PLATELET
BASOS ABS: 42 {cells}/uL (ref 0–200)
Basophils Relative: 1 %
EOS ABS: 126 {cells}/uL (ref 15–500)
EOS PCT: 3 %
HCT: 42.7 % (ref 35.0–45.0)
Hemoglobin: 13.9 g/dL (ref 12.0–15.0)
LYMPHS PCT: 35 %
Lymphs Abs: 1470 cells/uL (ref 850–3900)
MCH: 30.5 pg (ref 27.0–33.0)
MCHC: 32.6 g/dL (ref 32.0–36.0)
MCV: 93.8 fL (ref 80.0–100.0)
MONOS PCT: 9 %
MPV: 8.8 fL (ref 7.5–12.5)
Monocytes Absolute: 378 cells/uL (ref 200–950)
NEUTROS ABS: 2184 {cells}/uL (ref 1500–7800)
NEUTROS PCT: 52 %
PLATELETS: 180 10*3/uL (ref 140–400)
RBC: 4.55 MIL/uL (ref 3.80–5.10)
RDW: 14.2 % (ref 11.0–15.0)
WBC: 4.2 10*3/uL (ref 3.8–10.8)

## 2017-05-10 LAB — LIPID PANEL
CHOL/HDL RATIO: 2.4 ratio (ref <5.0)
Cholesterol: 135 mg/dL (ref <200)
HDL: 57 mg/dL (ref >50)
LDL Cholesterol: 60 mg/dL (ref <100)
TRIGLYCERIDES: 91 mg/dL (ref <150)
VLDL: 18 mg/dL (ref <30)

## 2017-05-11 LAB — VITAMIN D 25 HYDROXY (VIT D DEFICIENCY, FRACTURES): Vit D, 25-Hydroxy: 71 ng/mL (ref 30–100)

## 2017-05-11 LAB — TSH: TSH: 1.89 mIU/L

## 2017-06-05 ENCOUNTER — Ambulatory Visit: Payer: PPO

## 2017-06-05 ENCOUNTER — Ambulatory Visit (INDEPENDENT_AMBULATORY_CARE_PROVIDER_SITE_OTHER): Payer: PPO

## 2017-06-05 ENCOUNTER — Ambulatory Visit (INDEPENDENT_AMBULATORY_CARE_PROVIDER_SITE_OTHER): Payer: PPO | Admitting: Podiatry

## 2017-06-05 DIAGNOSIS — M7751 Other enthesopathy of right foot: Secondary | ICD-10-CM | POA: Diagnosis not present

## 2017-06-05 DIAGNOSIS — S9031XA Contusion of right foot, initial encounter: Secondary | ICD-10-CM

## 2017-06-05 DIAGNOSIS — M21619 Bunion of unspecified foot: Secondary | ICD-10-CM

## 2017-06-05 DIAGNOSIS — M25571 Pain in right ankle and joints of right foot: Secondary | ICD-10-CM | POA: Diagnosis not present

## 2017-06-05 MED ORDER — BETAMETHASONE SOD PHOS & ACET 6 (3-3) MG/ML IJ SUSP: 3.0000 mg | Freq: Once | INTRAMUSCULAR | Status: DC

## 2017-06-05 NOTE — Progress Notes (Signed)
   HPI: Patient presents today for evaluation of pain and tenderness to the second digit of the right foot. This pain is been going on for approximately 2 months now. Patient also states that she recently slipped and fell this past Wednesday approximately one week ago and noticed significant amount of swelling with ecchymosis throughout the right foot and ankle. She denies pain in has been ambulating in normal shoe gear. Patient presents today for further treatment and evaluation.   Physical Exam: General: The patient is alert and oriented x3 in no acute distress.  Dermatology: Skin is warm, dry and supple bilateral lower extremities. Negative for open lesions or macerations.  Vascular: Palpable pedal pulses bilaterally. There is extensive amount of edema with ecchymosis throughout the right foot ankle and leg.  Neurological: Epicritic and protective threshold grossly intact bilaterally.   Musculoskeletal Exam: Pain on palpation and range of motion noted to the second MPJ of the right foot. Range of motion within normal limits to all pedal and ankle joints bilateral. Muscle strength 5/5 in all groups bilateral.   Radiographic Exam:  Normal osseous mineralization. Joint spaces preserved. No fracture/dislocation/boney destruction.    Assessment: 1. Ecchymosis with edema right lower extremity secondary to trip and fall injury 2. Second MTPJ capsulitis right foot  Plan of Care:  1. Patient was evaluated. X-rays reviewed today demonstrating negative for fracture.  2. Injection of 0.5 mL Celestone Soluspan injected in the second MTPJ right foot. 3. Metatarsal pads dispensed to offload the second MPJ 4. Return to clinic when necessary   Edrick Kins, DPM [facility]  Dr. Edrick Kins, DPM    [address], [place] [address]                Office [phone]  Fax [phone]

## 2017-08-12 DIAGNOSIS — H2513 Age-related nuclear cataract, bilateral: Secondary | ICD-10-CM | POA: Diagnosis not present

## 2017-08-26 DIAGNOSIS — L821 Other seborrheic keratosis: Secondary | ICD-10-CM | POA: Diagnosis not present

## 2017-08-26 DIAGNOSIS — Z85828 Personal history of other malignant neoplasm of skin: Secondary | ICD-10-CM | POA: Diagnosis not present

## 2017-08-26 DIAGNOSIS — L812 Freckles: Secondary | ICD-10-CM | POA: Diagnosis not present

## 2017-08-26 DIAGNOSIS — D2271 Melanocytic nevi of right lower limb, including hip: Secondary | ICD-10-CM | POA: Diagnosis not present

## 2017-08-26 DIAGNOSIS — D225 Melanocytic nevi of trunk: Secondary | ICD-10-CM | POA: Diagnosis not present

## 2017-08-26 DIAGNOSIS — D1801 Hemangioma of skin and subcutaneous tissue: Secondary | ICD-10-CM | POA: Diagnosis not present

## 2017-09-18 ENCOUNTER — Other Ambulatory Visit: Payer: Self-pay | Admitting: Physician Assistant

## 2017-09-18 DIAGNOSIS — Z1231 Encounter for screening mammogram for malignant neoplasm of breast: Secondary | ICD-10-CM

## 2017-10-21 ENCOUNTER — Ambulatory Visit
Admission: RE | Admit: 2017-10-21 | Discharge: 2017-10-21 | Disposition: A | Discharge status: Home / Self Care | Payer: PPO | Source: Ambulatory Visit | Attending: Physician Assistant | Admitting: Physician Assistant

## 2017-10-21 DIAGNOSIS — Z1231 Encounter for screening mammogram for malignant neoplasm of breast: Secondary | ICD-10-CM

## 2017-11-27 DIAGNOSIS — I8311 Varicose veins of right lower extremity with inflammation: Secondary | ICD-10-CM | POA: Diagnosis not present

## 2018-06-23 ENCOUNTER — Other Ambulatory Visit: Payer: Self-pay

## 2018-06-23 ENCOUNTER — Other Ambulatory Visit: Payer: PPO

## 2018-06-23 DIAGNOSIS — Z0000 Encounter for general adult medical examination without abnormal findings: Secondary | ICD-10-CM

## 2018-06-24 LAB — COMPLETE METABOLIC PANEL WITH GFR
AG RATIO: 1.5 (calc) (ref 1.0–2.5)
ALKALINE PHOSPHATASE (APISO): 75 U/L (ref 33–130)
ALT: 15 U/L (ref 6–29)
AST: 13 U/L (ref 10–35)
Albumin: 4.2 g/dL (ref 3.6–5.1)
BUN: 13 mg/dL (ref 7–25)
CO2: 24 mmol/L (ref 20–32)
Calcium: 9.5 mg/dL (ref 8.6–10.4)
Chloride: 108 mmol/L (ref 98–110)
Creat: 0.73 mg/dL (ref 0.50–0.99)
GFR, EST NON AFRICAN AMERICAN: 85 mL/min/{1.73_m2} (ref >=60)
GFR, Est African American: 99 mL/min/{1.73_m2} (ref >=60)
GLOBULIN: 2.8 g/dL (ref 1.9–3.7)
Glucose, Bld: 97 mg/dL (ref 65–99)
POTASSIUM: 4.5 mmol/L (ref 3.5–5.3)
SODIUM: 142 mmol/L (ref 135–146)
Total Bilirubin: 0.3 mg/dL (ref 0.2–1.2)
Total Protein: 7 g/dL (ref 6.1–8.1)

## 2018-06-24 LAB — CBC WITH DIFFERENTIAL/PLATELET
BASOS PCT: 0.7 %
Basophils Absolute: 29 cells/uL (ref 0–200)
EOS ABS: 82 {cells}/uL (ref 15–500)
Eosinophils Relative: 2 %
HCT: 41.5 % (ref 35.0–45.0)
Hemoglobin: 13.9 g/dL (ref 11.7–15.5)
Lymphs Abs: 1419 cells/uL (ref 850–3900)
MCH: 31.2 pg (ref 27.0–33.0)
MCHC: 33.5 g/dL (ref 32.0–36.0)
MCV: 93 fL (ref 80.0–100.0)
MPV: 9.5 fL (ref 7.5–12.5)
Monocytes Relative: 9.6 %
Neutro Abs: 2177 cells/uL (ref 1500–7800)
Neutrophils Relative %: 53.1 %
PLATELETS: 186 10*3/uL (ref 140–400)
RBC: 4.46 10*6/uL (ref 3.80–5.10)
RDW: 13 % (ref 11.0–15.0)
TOTAL LYMPHOCYTE: 34.6 %
WBC mixed population: 394 cells/uL (ref 200–950)
WBC: 4.1 10*3/uL (ref 3.8–10.8)

## 2018-06-24 LAB — LIPID PANEL
CHOL/HDL RATIO: 2.8 (calc) (ref <5.0)
CHOLESTEROL: 155 mg/dL (ref <200)
HDL: 55 mg/dL (ref >50)
LDL Cholesterol (Calc): 76 mg/dL (calc)
Non-HDL Cholesterol (Calc): 100 mg/dL (calc) (ref <130)
Triglycerides: 138 mg/dL (ref <150)

## 2018-06-24 LAB — VITAMIN D 25 HYDROXY (VIT D DEFICIENCY, FRACTURES): VIT D 25 HYDROXY: 61 ng/mL (ref 30–100)

## 2018-06-24 LAB — HEPATITIS C ANTIBODY
HEP C AB: NONREACTIVE
SIGNAL TO CUT-OFF: 0.02 (ref <1.00)

## 2018-06-24 LAB — TSH: TSH: 2.18 mIU/L (ref 0.40–4.50)

## 2018-06-25 ENCOUNTER — Encounter: Payer: Self-pay | Admitting: Physician Assistant

## 2018-06-25 ENCOUNTER — Other Ambulatory Visit: Payer: Self-pay

## 2018-06-25 ENCOUNTER — Ambulatory Visit (INDEPENDENT_AMBULATORY_CARE_PROVIDER_SITE_OTHER): Payer: PPO | Admitting: Physician Assistant

## 2018-06-25 VITALS — BP 118/74 | HR 69 | Temp 97.6°F | Resp 16 | Ht 65.0 in | Wt 153.0 lb

## 2018-06-25 DIAGNOSIS — Z0000 Encounter for general adult medical examination without abnormal findings: Secondary | ICD-10-CM | POA: Diagnosis not present

## 2018-06-25 NOTE — Progress Notes (Signed)
Patient ID: Norma Collins MRN: [id number], DOB: Apr 07, 1951, 67 y.o. Date of Encounter: 06/25/2018,   Chief Complaint: Medicare Physical (CPE)  HPI: 67 y.o. y/o female here for CPE.   She has no specific concerns to address today.  She states that she has been feeling well and everything has been feeling stable. She goes to the exercise facility located on [address] 2 or 3 days/week.  Walks on the treadmill there and does some light weights. States that she stays busy all the time.  Reads books.  Likes to help with the food pantry.   Review of Systems: Consitutional: No fever, chills, fatigue, night sweats, lymphadenopathy. No significant/unexplained weight changes. Eyes: No visual changes, eye redness, or discharge. ENT/Mouth: No ear pain, sore throat, nasal drainage, or sinus pain. Cardiovascular: No chest pressure,heaviness, tightness or squeezing, even with exertion. No increased shortness of breath or dyspnea on exertion.No palpitations, edema, orthopnea, PND. Respiratory: No cough, hemoptysis, SOB, or wheezing. Gastrointestinal: No anorexia, dysphagia, reflux, pain, nausea, vomiting, hematemesis, diarrhea, constipation, BRBPR, or melena. Breast: No mass, nodules, bulging, or retraction. No skin changes or inflammation. No nipple discharge. No lymphadenopathy. Genitourinary: No dysuria, hematuria, incontinence, vaginal discharge, pruritis, burning, abnormal bleeding, or pain. Musculoskeletal: No decreased ROM, No joint pain or swelling. No significant pain in neck, back, or extremities. Skin: No rash, pruritis, or concerning lesions. Neurological: No headache, dizziness, syncope, seizures, tremors, memory loss, coordination problems, or paresthesias. Psychological: No anxiety, depression, hallucinations, SI/HI. Endocrine: No polydipsia, polyphagia, polyuria, or known diabetes.No increased fatigue. No palpitations/rapid heart rate. No significant/unexplained weight change. All  other systems were reviewed and are otherwise negative.  Past Medical History:  Diagnosis Date  . Actinic keratosis 07/20/2012   Cryotherapy- Right Forehead  . Diverticulosis   . Hemorrhoids   . Osteopenia   . Skin cancer 12/20/2010   Squamous Cell In Situ--Right Chest     History reviewed. No pertinent surgical history.  Home Meds:  Outpatient Medications Prior to Visit  Medication Sig Dispense Refill  . calcium carbonate (OS-CAL) 600 MG TABS tablet Take 600 mg by mouth 2 (two) times daily with a meal.    . cholecalciferol (VITAMIN D) 1000 UNITS tablet Take 1,000 Units by mouth daily.     Facility-Administered Medications Prior to Visit  Medication Dose Route Frequency Provider Last Rate Last Dose  . betamethasone acetate-betamethasone sodium phosphate (CELESTONE) injection 3 mg  3 mg Intramuscular Once Edrick Kins, DPM        Allergies: No Known Allergies  Social History   Socioeconomic History  . Marital status: Single    Spouse name: Not on file  . Number of children: Not on file  . Years of education: Not on file  . Highest education level: Not on file  Occupational History  . Not on file  Social Needs  . Financial resource strain: Not on file  . Food insecurity:    Worry: Not on file    Inability: Not on file  . Transportation needs:    Medical: Not on file    Non-medical: Not on file  Tobacco Use  . Smoking status: Never Smoker  . Smokeless tobacco: Never Used  Substance and Sexual Activity  . Alcohol use: No  . Drug use: No  . Sexual activity: Never  Lifestyle  . Physical activity:    Days per week: Not on file    Minutes per session: Not on file  . Stress: Not on file  Relationships  .  Social connections:    Talks on phone: Not on file    Gets together: Not on file    Attends religious service: Not on file    Active member of club or organization: Not on file    Attends meetings of clubs or organizations: Not on file    Relationship status: Not  on file  . Intimate partner violence:    Fear of current or ex partner: Not on file    Emotionally abused: Not on file    Physically abused: Not on file    Forced sexual activity: Not on file  Other Topics Concern  . Not on file  Social History Narrative   She is retired. She did work as a Software engineer.   She has no children.   She walks every day outside for 30 minutes. She does yoga videos at home.   She is involved in her church and likes to read.    Family History  Problem Relation Age of Onset  . Cancer Mother 52       Colon Cancer  . Cancer Father 66       GallBladder Cancer    Physical Exam: Blood pressure 118/74, pulse 69, temperature 97.6 F (36.4 C), temperature source Oral, resp. rate 16, height 5\' 5"  (1.651 m), weight 69.4 kg (153 lb), SpO2 97 %., Body mass index is 25.46 kg/m. General: Well developed, well nourished WF. Appears in no acute distress. HEENT: Normocephalic, atraumatic. Conjunctiva pink, sclera non-icteric. Pupils 2 mm constricting to 1 mm, round, regular, and equally reactive to light and accomodation. EOMI. Internal auditory canal clear. TMs with good cone of light and without pathology. Nasal mucosa pink. Nares are without discharge. No sinus tenderness. Oral mucosa pink. Neck: Supple. Trachea midline. No thyromegaly. Full ROM. No lymphadenopathy.No Carotid Bruits. Lungs: Clear to auscultation bilaterally without wheezes, rales, or rhonchi. Breathing is of normal effort and unlabored. Cardiovascular: RRR with S1 S2. No murmurs, rubs, or gallops. Distal pulses 2+ symmetrically. No carotid or abdominal bruits. Breast: She refuses Breast Exam.  She does self breast exams and has annual mammograms but every year refuses breast exam here. Abdomen: Soft, non-tender, non-distended with normoactive bowel sounds. No hepatosplenomegaly or masses. No rebound/guarding. No CVA tenderness. No hernias.  Genitourinary: She refuses pelvic exam.  Aware of risk versus benefit  but refuses pelvic exam here every year.  She reports that she is having no bleeding.  No pain. Musculoskeletal: Full range of motion and 5/5 strength throughout. Skin: Warm and moist without erythema, ecchymosis, wounds, or rash. Neuro: A+Ox3. CN II-XII grossly intact. Moves all extremities spontaneously. Full sensation throughout. Normal gait.  Psych:  Responds to questions appropriately with a normal affect.   Assessment/Plan:  67 y.o. y/o female here for CPE  1. Medicare annual wellness visit, subsequent  2. Encounter for preventive health examination  A. Screening Labs: She recently came fasting for labs.  Reviewed these findings today.  All labs are completely normal and lipids are excellent.  B. Pap: N/A-- > 57 y/o  C. Screening Mammogram: Last mammogram 10/21/2017--negative  D. DEXA/BMD:  See note attached to CPE 05/09/2017 for details.  She had been on Fosamax for at least 5 years and completed 5-year course of this therapy.  Since then she has been maintained on calcium and vitamin D.  E. Colorectal Cancer Screening: She has premature family history of colon cancer.  Therefore has had colonoscopy every 5 years.  Performed by Dr. Watt Climes.  She does  keep up with having these done routinely.   She had colonoscopy September 2010.  Was due to repeat September 2015. Had follow-up colonoscopy with Dr. Watt Climes was 12/20/2014. She states that she will make sure to follow-up when her next colonoscopy is due.  F. Immunizations:  Influenza:    N/A Tetanus:----Received Tdap here April 2013 Pneumococcal: I discussed pneumonia vaccine today 06/25/2018.  Discussed risks versus benefits.  Discussed the recommendations.  She says that she will "think about it ". Zostavax: She received Zostavax January 2012.   Subjective:   Patient presents for Medicare Annual/Subsequent preventive examination.   Review Past Medical/Family/Social: This information is reviewed today.  It is documented in its  tabs in epic.   Risk Factors  Current exercise habits: She goes to exercise facility 2-3 times per week.  Walks on treadmill and does some light weights.  Also very active lifestyle. Dietary issues discussed: She eats very healthy diet.  Low-sodium low-cholesterol low carbohydrate diet.  Cardiac risk factors: Age  Depression Screen  (Note: if answer to either of the following is "Yes", a more complete depression screening is indicated)  Over the past two weeks, have you felt down, depressed or hopeless? No Over the past two weeks, have you felt little interest or pleasure in doing things? No Have you lost interest or pleasure in daily life? No Do you often feel hopeless? No Do you cry easily over simple problems? No   Activities of Daily Living  In your present state of health, do you have any difficulty performing the following activities?:  Driving? No  Managing money? No  Feeding yourself? No  Getting from bed to chair? No  Climbing a flight of stairs? No  Preparing food and eating?: No  Bathing or showering? No  Getting dressed: No  Getting to the toilet? No  Using the toilet:No  Moving around from place to place: No  In the past year have you fallen or had a near fall?:No  Are you sexually active? No  Do you have more than one partner? No   Hearing Difficulties: No  Do you often ask people to speak up or repeat themselves? No  Do you experience ringing or noises in your ears? No Do you have difficulty understanding soft or whispered voices? No  Do you feel that you have a problem with memory? No Do you often misplace items? No  Do you feel safe at home? Yes  Cognitive Testing  Alert? Yes Normal Appearance?Yes  Oriented to person? Yes Place? Yes  Time? Yes  Recall of three objects? Yes  Can perform simple calculations? Yes  Displays appropriate judgment?Yes  Can read the correct time from a watch face?Yes   List the Names of Other Physician/Practitioners you  currently use:  Dr. Watt Climes, Dr. Katy Fitch, breast center, foot clinic.  Jarome Matin dermatology, Dentist rourtinely  Indicate any recent Medical Services you may have received from other than [facility] providers in the past year (date may be approximate).  Dr. Watt Climes, Dr. Katy Fitch, breast center, foot clinic.  Jarome Matin dermatology, Dentist rourtinely  Screening Tests / Date-------- all of this information as documented above. Colonoscopy                     Zostavax  Mammogram  Influenza Vaccine  Tetanus/tdap    Assessment:    Annual wellness medicare exam   Plan:    During the course of the visit the patient was educated and counseled about  appropriate screening and preventive services including:  Screening mammography  Colorectal cancer screening  Medicare Attestation  I have personally reviewed:  The patient's medical and social history  Their use of alcohol, tobacco or illicit drugs  Their current medications and supplements  The patient's functional ability including ADLs,fall risks, home safety risks, cognitive function. Diet and physical activities  No evidence for depression or mood disorders  The patient's weight, height, BMI have been recorded in the chart.    Signed, [address] [place], [place], [facility] 06/25/2018 9:57 AM

## 2018-08-19 DIAGNOSIS — H2513 Age-related nuclear cataract, bilateral: Secondary | ICD-10-CM | POA: Diagnosis not present

## 2018-09-01 DIAGNOSIS — L812 Freckles: Secondary | ICD-10-CM | POA: Diagnosis not present

## 2018-09-01 DIAGNOSIS — Z85828 Personal history of other malignant neoplasm of skin: Secondary | ICD-10-CM | POA: Diagnosis not present

## 2018-09-01 DIAGNOSIS — D2271 Melanocytic nevi of right lower limb, including hip: Secondary | ICD-10-CM | POA: Diagnosis not present

## 2018-09-01 DIAGNOSIS — D225 Melanocytic nevi of trunk: Secondary | ICD-10-CM | POA: Diagnosis not present

## 2018-09-01 DIAGNOSIS — L821 Other seborrheic keratosis: Secondary | ICD-10-CM | POA: Diagnosis not present

## 2018-09-01 DIAGNOSIS — D2272 Melanocytic nevi of left lower limb, including hip: Secondary | ICD-10-CM | POA: Diagnosis not present

## 2018-09-01 DIAGNOSIS — D1801 Hemangioma of skin and subcutaneous tissue: Secondary | ICD-10-CM | POA: Diagnosis not present

## 2018-09-11 ENCOUNTER — Other Ambulatory Visit: Payer: Self-pay | Admitting: Physician Assistant

## 2018-09-11 DIAGNOSIS — Z1231 Encounter for screening mammogram for malignant neoplasm of breast: Secondary | ICD-10-CM

## 2018-10-27 ENCOUNTER — Ambulatory Visit
Admission: RE | Admit: 2018-10-27 | Discharge: 2018-10-27 | Disposition: A | Discharge status: Home / Self Care | Payer: PPO | Source: Ambulatory Visit | Attending: Physician Assistant | Admitting: Physician Assistant

## 2018-10-27 DIAGNOSIS — Z1231 Encounter for screening mammogram for malignant neoplasm of breast: Secondary | ICD-10-CM | POA: Diagnosis not present

## 2019-01-21 DIAGNOSIS — I87323 Chronic venous hypertension (idiopathic) with inflammation of bilateral lower extremity: Secondary | ICD-10-CM | POA: Diagnosis not present

## 2019-02-11 ENCOUNTER — Encounter: Payer: Self-pay | Admitting: Family Medicine

## 2019-02-11 ENCOUNTER — Other Ambulatory Visit: Payer: Self-pay

## 2019-02-11 ENCOUNTER — Ambulatory Visit (INDEPENDENT_AMBULATORY_CARE_PROVIDER_SITE_OTHER): Payer: PPO | Admitting: Family Medicine

## 2019-02-11 DIAGNOSIS — B309 Viral conjunctivitis, unspecified: Secondary | ICD-10-CM

## 2019-02-11 DIAGNOSIS — J069 Acute upper respiratory infection, unspecified: Secondary | ICD-10-CM

## 2019-02-11 NOTE — Progress Notes (Signed)
Virtual Visit via Telephone Note  I connected with Norma Collins on 02/11/19 at 10:30 AM EDT by telephone and verified that I am speaking with the correct person using two identifiers.   I discussed the limitations, risks, security and privacy concerns of performing an evaluation and management service by telephone and the availability of in person appointments. I also discussed with the patient that there may be a patient responsible charge related to this service. The patient expressed understanding and agreed to proceed.  Services provided today were via telemedicine through telephone call.  Patient reported their location during encounter was at home  Patient consented to telephone visit  I conducted telephone visit from [facility]  Referring Provider:   Delsa Grana, PA-C   Start of phone call:  10:43 AM  All participants in encounter:  The patient and myself    History of Present Illness: She has had this "conjunctivitis" for a few days and thought for a few days she had a "little cold" clear nasal drainage, mild cough, not productive, no chest congestion, CP, SOB, wheeze, fever, chills, sweats, body aches, sick contacts or travel hx.   Eyes are "matted and red" - "just my right eye" left one might be starting. Right eye is diffusely red, does not itch, matted - crusting in the morning.  She uses a warm compress to get it open and remove crusting, occasionally in the corner of her eye will get a small amount of white discharge.  Eye feels mildly "irritated", but she denies eye pain, blurry vision, swelling to eyelids or surrounding.  No foreign body sensation No contact lenses, uses readers - "she got old"     Observations/Objective: Unable to assess any objective findings due to telephone encounter  Assessment and Plan:     ICD-10-CM   1. Viral conjunctivitis of both eyes B30.9    continue warm/cold compresses, OTC eye drops if desired,  good hand hygeine, f/up if any change to vision, drainage, pain  2. Upper respiratory tract infection, unspecified type J06.9    mild nasal sx and dry cough - self isolation advised per CDC and NCDHHS COVID-19 recommendations  Suspect viral conjunctivitis with description, with lack of allergy sx, and lack of purulent drainage.  No risk factors such as wearing contacts lenses. No indication for abx drops at this time per history given and description of her PE findings  Can try OTC drops - avoid contamination, moderation with drops, d/c if causing any worsening  Follow Up Instructions: Encouraged to f/up with [place] ASAP if she experiences any purulent drainage, swelling or redness of eyelids or around eyes, severe eye pain, or visual disturbances/blurred vision   I discussed the assessment and treatment plan with the patient. The patient was provided an opportunity to ask questions and all were answered. The patient agreed with the plan and demonstrated an understanding of the instructions.   The patient was advised to call back or seek an in-person evaluation if the symptoms worsen or if the condition fails to improve as anticipated.  Phone call concluded 10:57 AM   I provided 13 minutes of non-face-to-face time during this encounter.    Delsa Grana, PA-C 02/11/19 11:11 AM

## 2019-06-29 ENCOUNTER — Other Ambulatory Visit: Payer: PPO

## 2019-06-30 ENCOUNTER — Encounter: Payer: PPO | Admitting: Family Medicine

## 2019-09-15 DIAGNOSIS — L821 Other seborrheic keratosis: Secondary | ICD-10-CM | POA: Diagnosis not present

## 2019-09-15 DIAGNOSIS — Z85828 Personal history of other malignant neoplasm of skin: Secondary | ICD-10-CM | POA: Diagnosis not present

## 2019-09-15 DIAGNOSIS — D225 Melanocytic nevi of trunk: Secondary | ICD-10-CM | POA: Diagnosis not present

## 2019-09-15 DIAGNOSIS — D1801 Hemangioma of skin and subcutaneous tissue: Secondary | ICD-10-CM | POA: Diagnosis not present

## 2019-09-15 DIAGNOSIS — L812 Freckles: Secondary | ICD-10-CM | POA: Diagnosis not present

## 2019-09-17 ENCOUNTER — Other Ambulatory Visit: Payer: Self-pay | Admitting: Family Medicine

## 2019-09-17 DIAGNOSIS — Z1231 Encounter for screening mammogram for malignant neoplasm of breast: Secondary | ICD-10-CM

## 2019-09-21 ENCOUNTER — Other Ambulatory Visit: Payer: Self-pay

## 2019-09-22 ENCOUNTER — Other Ambulatory Visit: Payer: PPO

## 2019-09-22 DIAGNOSIS — Z0000 Encounter for general adult medical examination without abnormal findings: Secondary | ICD-10-CM | POA: Diagnosis not present

## 2019-09-23 ENCOUNTER — Ambulatory Visit (INDEPENDENT_AMBULATORY_CARE_PROVIDER_SITE_OTHER): Payer: PPO | Admitting: Family Medicine

## 2019-09-23 ENCOUNTER — Other Ambulatory Visit: Payer: Self-pay

## 2019-09-23 ENCOUNTER — Encounter: Payer: Self-pay | Admitting: Family Medicine

## 2019-09-23 VITALS — BP 120/66 | HR 96 | Temp 98.7°F | Resp 14 | Ht 65.75 in | Wt 159.0 lb

## 2019-09-23 DIAGNOSIS — Z0001 Encounter for general adult medical examination with abnormal findings: Secondary | ICD-10-CM | POA: Diagnosis not present

## 2019-09-23 DIAGNOSIS — M8589 Other specified disorders of bone density and structure, multiple sites: Secondary | ICD-10-CM | POA: Diagnosis not present

## 2019-09-23 DIAGNOSIS — Z0000 Encounter for general adult medical examination without abnormal findings: Secondary | ICD-10-CM

## 2019-09-23 LAB — COMPREHENSIVE METABOLIC PANEL
AG Ratio: 1.5 (calc) (ref 1.0–2.5)
ALT: 19 U/L (ref 6–29)
AST: 14 U/L (ref 10–35)
Albumin: 4.2 g/dL (ref 3.6–5.1)
Alkaline phosphatase (APISO): 85 U/L (ref 37–153)
BUN: 15 mg/dL (ref 7–25)
CO2: 26 mmol/L (ref 20–32)
Calcium: 9.5 mg/dL (ref 8.6–10.4)
Chloride: 108 mmol/L (ref 98–110)
Creat: 0.77 mg/dL (ref 0.50–0.99)
Globulin: 2.8 g/dL (calc) (ref 1.9–3.7)
Glucose, Bld: 94 mg/dL (ref 65–99)
Potassium: 4.8 mmol/L (ref 3.5–5.3)
Sodium: 143 mmol/L (ref 135–146)
Total Bilirubin: 0.4 mg/dL (ref 0.2–1.2)
Total Protein: 7 g/dL (ref 6.1–8.1)

## 2019-09-23 LAB — CBC WITH DIFFERENTIAL/PLATELET
Absolute Monocytes: 437 cells/uL (ref 200–950)
Basophils Absolute: 38 cells/uL (ref 0–200)
Basophils Relative: 0.8 %
Eosinophils Absolute: 230 cells/uL (ref 15–500)
Eosinophils Relative: 4.8 %
HCT: 42.1 % (ref 35.0–45.0)
Hemoglobin: 14.1 g/dL (ref 11.7–15.5)
Lymphs Abs: 1464 cells/uL (ref 850–3900)
MCH: 31.8 pg (ref 27.0–33.0)
MCHC: 33.5 g/dL (ref 32.0–36.0)
MCV: 94.8 fL (ref 80.0–100.0)
MPV: 9.6 fL (ref 7.5–12.5)
Monocytes Relative: 9.1 %
Neutro Abs: 2630 cells/uL (ref 1500–7800)
Neutrophils Relative %: 54.8 %
Platelets: 196 10*3/uL (ref 140–400)
RBC: 4.44 10*6/uL (ref 3.80–5.10)
RDW: 12.9 % (ref 11.0–15.0)
Total Lymphocyte: 30.5 %
WBC: 4.8 10*3/uL (ref 3.8–10.8)

## 2019-09-23 LAB — LIPID PANEL
Cholesterol: 160 mg/dL (ref <200)
HDL: 53 mg/dL (ref >=50)
LDL Cholesterol (Calc): 82 mg/dL (calc)
Non-HDL Cholesterol (Calc): 107 mg/dL (calc) (ref <130)
Total CHOL/HDL Ratio: 3 (calc) (ref <5.0)
Triglycerides: 146 mg/dL (ref <150)

## 2019-09-23 NOTE — Patient Instructions (Signed)
F/u 1 YEAR FOR PHYSICAL  Schedule your bone density

## 2019-09-23 NOTE — Progress Notes (Signed)
Subjective:   Patient presents for Medicare Annual/Subsequent preventive examination.   Pt here for wellness exam    Osteopenia, last Bone Density was in 2014 she is on calcium and vitamin D days active weightbearing exercise Review Past Medical/Family/Social: Per EMR    Risk Factors  Current exercise habits: Exercises regularly Dietary issues discussed: No significant concerns  Cardiac risk factors: None  Depression Screen  (Note: if answer to either of the following is "Yes", a more complete depression screening is indicated)  Over the past two weeks, have you felt down, depressed or hopeless? No Over the past two weeks, have you felt little interest or pleasure in doing things? No Have you lost interest or pleasure in daily life? No Do you often feel hopeless? No Do you cry easily over simple problems? No   Activities of Daily Living  In your present state of health, do you have any difficulty performing the following activities?:  Driving? No  Managing money? No  Feeding yourself? No  Getting from bed to chair? No  Climbing a flight of stairs? No  Preparing food and eating?: No  Bathing or showering? No  Getting dressed: No  Getting to the toilet? No  Using the toilet:No  Moving around from place to place: No  In the past year have you fallen or had a near fall?:No  Are you sexually active? No  Do you have more than one partner? No   Hearing Difficulties: No  Do you often ask people to speak up or repeat themselves? No  Do you experience ringing or noises in your ears? No Do you have difficulty understanding soft or whispered voices? No  Do you feel that you have a problem with memory? No Do you often misplace items? No  Do you feel safe at home? Yes  Cognitive Testing  Alert? Yes Normal Appearance?Yes  Oriented to person? Yes Place? Yes  Time? Yes  Recall of three objects? Yes  Can perform simple calculations? Yes  Displays appropriate judgment?Yes  Can read  the correct time from a watch face?Yes   List the Names of Other Physician/Practitioners you currently use:   DR. Jarome Matin- Dermatology  Ophthalmology- Dr. Lazaro Arms   Dentist  Vascular - Dr. Jones Skene    Screening Tests / Date Colonoscopy    UTD                 Zostavax  UTD Mammogram Due in Jan  Influenza Vaccine Declines  Tetanus/tdap UTD   pneumonia vaccine declines  ROS: GEN- denies fatigue, fever, weight loss,weakness, recent illness HEENT- denies eye drainage, change in vision, nasal discharge, CVS- denies chest pain, palpitations RESP- denies SOB, cough, wheeze ABD- denies N/V, change in stools, abd pain GU- denies dysuria, hematuria, dribbling, incontinence MSK- denies joint pain, muscle aches, injury Neuro- denies headache, dizziness, syncope, seizure activity  Physical: vitals reviewed  GEN- NAD, alert and oriented x3 HEENT- PERRL, EOMI, non injected sclera, pink conjunctiva, MMM, oropharynx clear Neck- Supple, no thryomegaly, no bruit  CVS- RRR, no murmur RESP-CTAB ABD-NABS,softNT,ND EXT- No edema Pulses- Radial, DP- 2+    Assessment:    Annual wellness medicare exam   Plan:    During the course of the visit the patient was educated and counseled about appropriate screening and preventive services including:  Screening mammography / Bone Density- scheduled for December Colorectal cancer screening up-to-date  Fall/depression/audit screen negative  Osteopenia per above bone density to be scheduled for December continue calcium  vitamin D weightbearing exercise in the past she was on Fosamax.  She has living well currently full code   She is a very healthy individual for her age.  No major concerns.  She does follow with her dermatologist will continue with her eye doctor  -Labs were reviewed at the bedside unremarkable   Patient Instructions (the written plan) was given to the patient.  Medicare Attestation  I have personally reviewed:   The patient's medical and social history  Their use of alcohol, tobacco or illicit drugs  Their current medications and supplements  The patient's functional ability including ADLs,fall risks, home safety risks, cognitive, and hearing and visual impairment  Diet and physical activities  Evidence for depression or mood disorders  The patient's weight, height, BMI, and visual acuity have been recorded in the chart. I have made referrals, counseling, and provided education to the patient based on review of the above and I have provided the patient with a written personalized care plan for preventive services.

## 2019-11-09 ENCOUNTER — Ambulatory Visit: Payer: PPO

## 2019-12-17 ENCOUNTER — Ambulatory Visit
Admission: RE | Admit: 2019-12-17 | Discharge: 2019-12-17 | Disposition: A | Discharge status: Home / Self Care | Payer: PPO | Source: Ambulatory Visit | Attending: Family Medicine | Admitting: Family Medicine

## 2019-12-17 ENCOUNTER — Other Ambulatory Visit: Payer: Self-pay

## 2019-12-17 DIAGNOSIS — M85852 Other specified disorders of bone density and structure, left thigh: Secondary | ICD-10-CM | POA: Diagnosis not present

## 2019-12-17 DIAGNOSIS — Z1231 Encounter for screening mammogram for malignant neoplasm of breast: Secondary | ICD-10-CM

## 2019-12-17 DIAGNOSIS — M8589 Other specified disorders of bone density and structure, multiple sites: Secondary | ICD-10-CM

## 2019-12-17 DIAGNOSIS — Z780 Asymptomatic menopausal state: Secondary | ICD-10-CM | POA: Diagnosis not present

## 2019-12-18 ENCOUNTER — Encounter: Payer: Self-pay | Admitting: *Deleted

## 2020-03-17 DIAGNOSIS — Z1159 Encounter for screening for other viral diseases: Secondary | ICD-10-CM | POA: Diagnosis not present

## 2020-03-22 DIAGNOSIS — K5730 Diverticulosis of large intestine without perforation or abscess without bleeding: Secondary | ICD-10-CM | POA: Diagnosis not present

## 2020-03-22 DIAGNOSIS — Z800 Family history of malignant neoplasm of digestive organs: Secondary | ICD-10-CM | POA: Diagnosis not present

## 2020-05-09 ENCOUNTER — Other Ambulatory Visit: Payer: Self-pay

## 2020-05-09 ENCOUNTER — Encounter: Payer: Self-pay | Admitting: Podiatry

## 2020-05-09 ENCOUNTER — Ambulatory Visit: Payer: PPO | Admitting: Podiatry

## 2020-05-09 ENCOUNTER — Ambulatory Visit (INDEPENDENT_AMBULATORY_CARE_PROVIDER_SITE_OTHER): Payer: PPO

## 2020-05-09 DIAGNOSIS — M79671 Pain in right foot: Secondary | ICD-10-CM | POA: Diagnosis not present

## 2020-05-09 DIAGNOSIS — M722 Plantar fascial fibromatosis: Secondary | ICD-10-CM | POA: Diagnosis not present

## 2020-05-09 MED ORDER — MELOXICAM 15 MG PO TABS: 15.0000 mg | ORAL_TABLET | Freq: Every day | ORAL | 1 refills | Status: AC

## 2020-05-09 NOTE — Progress Notes (Signed)
   Subjective: 69 y.o. female for evaluation of right heel pain that is been going on for approximately 5 weeks now.  Patient feels the pain more when getting out of bed in the mornings.  She has been using a heating pad and is tried some OTC insoles that have not provided any significant alleviation of symptoms.   Past Medical History:  Diagnosis Date  . Actinic keratosis 07/20/2012   Cryotherapy- Right Forehead  . Diverticulosis   . Hemorrhoids   . Osteopenia   . Skin cancer 12/20/2010   Squamous Cell In Situ--Right Chest     Objective: Physical Exam General: The patient is alert and oriented x3 in no acute distress.  Dermatology: Skin is warm, dry and supple bilateral lower extremities. Negative for open lesions or macerations bilateral.   Vascular: Dorsalis Pedis and Posterior Tibial pulses palpable bilateral.  Capillary fill time is immediate to all digits.  Neurological: Epicritic and protective threshold intact bilateral.   Musculoskeletal: Tenderness to palpation to the plantar aspect of the right heel along the plantar fascia. All other joints range of motion within normal limits bilateral. Strength 5/5 in all groups bilateral.   Assessment: 1. Plantar fasciitis right  Plan of Care:  1. Patient evaluated. Xrays reviewed.   2.  Patient declined injection today 3. Rx for Meloxicam ordered for patient. 5.  OTC power step insoles provided today. 6. Instructed patient regarding therapies and modalities at home to alleviate symptoms.  7. Return to clinic in 4 weeks.     Edrick Kins, DPM [facility]  Dr. Edrick Kins, DPM    [address] [place], [place] [address]                Office [phone]  Fax [phone]

## 2020-06-08 ENCOUNTER — Ambulatory Visit: Payer: PPO | Admitting: Podiatry

## 2020-06-20 ENCOUNTER — Other Ambulatory Visit: Payer: Self-pay | Admitting: Podiatry

## 2020-08-30 DIAGNOSIS — H2513 Age-related nuclear cataract, bilateral: Secondary | ICD-10-CM | POA: Diagnosis not present

## 2020-08-30 DIAGNOSIS — H524 Presbyopia: Secondary | ICD-10-CM | POA: Diagnosis not present

## 2020-09-19 DIAGNOSIS — D1801 Hemangioma of skin and subcutaneous tissue: Secondary | ICD-10-CM | POA: Diagnosis not present

## 2020-09-19 DIAGNOSIS — D2271 Melanocytic nevi of right lower limb, including hip: Secondary | ICD-10-CM | POA: Diagnosis not present

## 2020-09-19 DIAGNOSIS — D485 Neoplasm of uncertain behavior of skin: Secondary | ICD-10-CM | POA: Diagnosis not present

## 2020-09-19 DIAGNOSIS — L820 Inflamed seborrheic keratosis: Secondary | ICD-10-CM | POA: Diagnosis not present

## 2020-09-19 DIAGNOSIS — Z85828 Personal history of other malignant neoplasm of skin: Secondary | ICD-10-CM | POA: Diagnosis not present

## 2020-09-19 DIAGNOSIS — D224 Melanocytic nevi of scalp and neck: Secondary | ICD-10-CM | POA: Diagnosis not present

## 2020-09-19 DIAGNOSIS — L821 Other seborrheic keratosis: Secondary | ICD-10-CM | POA: Diagnosis not present

## 2020-09-20 ENCOUNTER — Other Ambulatory Visit: Payer: PPO

## 2020-09-20 ENCOUNTER — Other Ambulatory Visit: Payer: Self-pay

## 2020-09-20 DIAGNOSIS — Z13220 Encounter for screening for lipoid disorders: Secondary | ICD-10-CM

## 2020-09-20 DIAGNOSIS — Z136 Encounter for screening for cardiovascular disorders: Secondary | ICD-10-CM | POA: Diagnosis not present

## 2020-09-21 LAB — CBC WITH DIFFERENTIAL/PLATELET
Absolute Monocytes: 446 cells/uL (ref 200–950)
Basophils Absolute: 39 cells/uL (ref 0–200)
Basophils Relative: 0.8 %
Eosinophils Absolute: 78 cells/uL (ref 15–500)
Eosinophils Relative: 1.6 %
HCT: 44.2 % (ref 35.0–45.0)
Hemoglobin: 14.9 g/dL (ref 11.7–15.5)
Lymphs Abs: 1264 cells/uL (ref 850–3900)
MCH: 31.6 pg (ref 27.0–33.0)
MCHC: 33.7 g/dL (ref 32.0–36.0)
MCV: 93.8 fL (ref 80.0–100.0)
MPV: 9.4 fL (ref 7.5–12.5)
Monocytes Relative: 9.1 %
Neutro Abs: 3072 cells/uL (ref 1500–7800)
Neutrophils Relative %: 62.7 %
Platelets: 211 10*3/uL (ref 140–400)
RBC: 4.71 10*6/uL (ref 3.80–5.10)
RDW: 12.9 % (ref 11.0–15.0)
Total Lymphocyte: 25.8 %
WBC: 4.9 10*3/uL (ref 3.8–10.8)

## 2020-09-21 LAB — LIPID PANEL
Cholesterol: 165 mg/dL (ref <200)
HDL: 52 mg/dL (ref >=50)
LDL Cholesterol (Calc): 89 mg/dL (calc)
Non-HDL Cholesterol (Calc): 113 mg/dL (calc) (ref <130)
Total CHOL/HDL Ratio: 3.2 (calc) (ref <5.0)
Triglycerides: 138 mg/dL (ref <150)

## 2020-09-21 LAB — COMPLETE METABOLIC PANEL WITH GFR
AG Ratio: 1.5 (calc) (ref 1.0–2.5)
ALT: 19 U/L (ref 6–29)
AST: 15 U/L (ref 10–35)
Albumin: 4.6 g/dL (ref 3.6–5.1)
Alkaline phosphatase (APISO): 84 U/L (ref 37–153)
BUN: 16 mg/dL (ref 7–25)
CO2: 27 mmol/L (ref 20–32)
Calcium: 10.1 mg/dL (ref 8.6–10.4)
Chloride: 105 mmol/L (ref 98–110)
Creat: 0.85 mg/dL (ref 0.50–0.99)
GFR, Est African American: 81 mL/min/{1.73_m2} (ref >=60)
GFR, Est Non African American: 70 mL/min/{1.73_m2} (ref >=60)
Globulin: 3 g/dL (calc) (ref 1.9–3.7)
Glucose, Bld: 103 mg/dL — ABNORMAL HIGH (ref 65–99)
Potassium: 4.8 mmol/L (ref 3.5–5.3)
Sodium: 141 mmol/L (ref 135–146)
Total Bilirubin: 0.5 mg/dL (ref 0.2–1.2)
Total Protein: 7.6 g/dL (ref 6.1–8.1)

## 2020-09-23 ENCOUNTER — Encounter: Payer: Self-pay | Admitting: Family Medicine

## 2020-09-23 ENCOUNTER — Other Ambulatory Visit: Payer: Self-pay

## 2020-09-23 ENCOUNTER — Ambulatory Visit (INDEPENDENT_AMBULATORY_CARE_PROVIDER_SITE_OTHER): Payer: PPO | Admitting: Family Medicine

## 2020-09-23 VITALS — BP 122/62 | HR 82 | Temp 98.0°F | Resp 14 | Ht 65.0 in | Wt 162.0 lb

## 2020-09-23 DIAGNOSIS — Z0001 Encounter for general adult medical examination with abnormal findings: Secondary | ICD-10-CM | POA: Diagnosis not present

## 2020-09-23 DIAGNOSIS — M8589 Other specified disorders of bone density and structure, multiple sites: Secondary | ICD-10-CM

## 2020-09-23 DIAGNOSIS — Z0000 Encounter for general adult medical examination without abnormal findings: Secondary | ICD-10-CM

## 2020-09-23 NOTE — Patient Instructions (Signed)
F/u 1 year for physical

## 2020-09-23 NOTE — Progress Notes (Signed)
Pt here for wellness exam    Osteopenia, last Bone Density was in 2014 she is on calcium and vitamin D days active weightbearing exercise   Dermatology - had a spot removed from right cheek, awaiting pathology    She had plantar fascitis , she was given mobic to use prn    It has improvved    Colonoscopy done May 2021  Review Past Medical/Family/Social: Per EMR    Risk Factors  Current exercise habits: Exercises regularly Dietary issues discussed: No significant concerns  Cardiac risk factors: None  Depression Screen  (Note: if answer to either of the following is "Yes", a more complete depression screening is indicated)  Over the past two weeks, have you felt down, depressed or hopeless? No Over the past two weeks, have you felt little interest or pleasure in doing things? No Have you lost interest or pleasure in daily life? No Do you often feel hopeless? No Do you cry easily over simple problems? No   Activities of Daily Living  In your present state of health, do you have any difficulty performing the following activities?:  Driving? No  Managing money? No  Feeding yourself? No  Getting from bed to chair? No  Climbing a flight of stairs? No  Preparing food and eating?: No  Bathing or showering? No  Getting dressed: No  Getting to the toilet? No  Using the toilet:No  Moving around from place to place: No  In the past year have you fallen or had a near fall?:No     Hearing Difficulties: No  Do you often ask people to speak up or repeat themselves? No  Do you experience ringing or noises in your ears? No Do you have difficulty understanding soft or whispered voices? No  Do you feel that you have a problem with memory? No Do you often misplace items? No  Do you feel safe at home? Yes  Cognitive Testing  Alert? Yes Normal Appearance?Yes  Oriented to person? Yes Place? Yes  Time? Yes  Recall of three objects? Yes  Can perform simple calculations? Yes   Displays appropriate judgment?Yes  Can read the correct time from a watch face?Yes   List the Names of Other Physician/Practitioners you currently use:   DR. Jarome Matin- Dermatology  Ophthalmology- Dr. Lazaro Arms   Dentist  Vascular - Dr. Jones Skene  Podiatr Dr. Amalia Hailey   Screening Tests / Date Colonoscopy    UTD                 Zostavax  UTD Mammogram Due in Jan  Influenza Vaccine Declines  Tetanus/tdap UTD   pneumonia vaccine declines  ROS: GEN- denies fatigue, fever, weight loss,weakness, recent illness HEENT- denies eye drainage, change in vision, nasal discharge, CVS- denies chest pain, palpitations RESP- denies SOB, cough, wheeze ABD- denies N/V, change in stools, abd pain GU- denies dysuria, hematuria, dribbling, incontinence MSK- denies joint pain, muscle aches, injury Neuro- denies headache, dizziness, syncope, seizure activity  Physical: vitals reviewed  GEN- NAD, alert and oriented x3 HEENT- PERRL, EOMI, non injected sclera, pink conjunctiva, MMM, oropharynx clear Neck- Supple, no thryomegaly, no bruit  CVS- RRR, no murmur RESP-CTAB ABD-NABS,softNT,ND Psych normal affect and mood EXT- No edema Pulses- Radial, DP- 2+    Assessment:    Annual wellness medicare exam   Plan:    During the course of the visit the patient was educated and counseled about appropriate screening and preventive services including:  Prevention up-to-date.  Declines  immunizations Colorectal cancer screening up-to-date  Fall/depression/audit screen negative  Osteopenia per above bone density to be scheduled for December continue calcium vitamin D weightbearing exercise in the past she was on Fosamax.  Fasting labs reviewed at bedside.  Next year she will need a vitamin D added to her labs.  She has living will -  currently full code  She has gained weight with issues with plantar fascitis but now back to her routine              Follow-up with specialist  as indicated

## 2020-11-07 ENCOUNTER — Other Ambulatory Visit: Payer: Self-pay | Admitting: Family Medicine

## 2020-11-07 DIAGNOSIS — Z0000 Encounter for general adult medical examination without abnormal findings: Secondary | ICD-10-CM

## 2020-12-19 ENCOUNTER — Ambulatory Visit: Payer: PPO

## 2021-01-17 ENCOUNTER — Ambulatory Visit
Admission: RE | Admit: 2021-01-17 | Discharge: 2021-01-17 | Disposition: A | Discharge status: Home / Self Care | Payer: PPO | Source: Ambulatory Visit | Attending: Family Medicine | Admitting: Family Medicine

## 2021-01-17 ENCOUNTER — Other Ambulatory Visit: Payer: Self-pay

## 2021-01-17 DIAGNOSIS — Z0000 Encounter for general adult medical examination without abnormal findings: Secondary | ICD-10-CM

## 2021-01-17 DIAGNOSIS — Z1231 Encounter for screening mammogram for malignant neoplasm of breast: Secondary | ICD-10-CM | POA: Diagnosis not present

## 2021-04-13 DIAGNOSIS — M25561 Pain in right knee: Secondary | ICD-10-CM | POA: Diagnosis not present

## 2021-09-05 DIAGNOSIS — H524 Presbyopia: Secondary | ICD-10-CM | POA: Diagnosis not present

## 2021-09-05 DIAGNOSIS — H2513 Age-related nuclear cataract, bilateral: Secondary | ICD-10-CM | POA: Diagnosis not present

## 2021-09-27 DIAGNOSIS — L812 Freckles: Secondary | ICD-10-CM | POA: Diagnosis not present

## 2021-09-27 DIAGNOSIS — D225 Melanocytic nevi of trunk: Secondary | ICD-10-CM | POA: Diagnosis not present

## 2021-09-27 DIAGNOSIS — Z85828 Personal history of other malignant neoplasm of skin: Secondary | ICD-10-CM | POA: Diagnosis not present

## 2021-09-27 DIAGNOSIS — L821 Other seborrheic keratosis: Secondary | ICD-10-CM | POA: Diagnosis not present

## 2021-09-27 DIAGNOSIS — D2261 Melanocytic nevi of right upper limb, including shoulder: Secondary | ICD-10-CM | POA: Diagnosis not present

## 2021-11-23 DIAGNOSIS — Z136 Encounter for screening for cardiovascular disorders: Secondary | ICD-10-CM | POA: Diagnosis not present

## 2021-11-23 DIAGNOSIS — Z0000 Encounter for general adult medical examination without abnormal findings: Secondary | ICD-10-CM | POA: Diagnosis not present

## 2021-11-23 DIAGNOSIS — Z13820 Encounter for screening for osteoporosis: Secondary | ICD-10-CM | POA: Diagnosis not present

## 2021-12-11 ENCOUNTER — Other Ambulatory Visit: Payer: Self-pay | Admitting: Physician Assistant

## 2021-12-11 DIAGNOSIS — Z1231 Encounter for screening mammogram for malignant neoplasm of breast: Secondary | ICD-10-CM

## 2022-01-18 ENCOUNTER — Ambulatory Visit
Admission: RE | Admit: 2022-01-18 | Discharge: 2022-01-18 | Disposition: A | Discharge status: Home / Self Care | Payer: PPO | Source: Ambulatory Visit | Attending: Physician Assistant | Admitting: Physician Assistant

## 2022-01-18 DIAGNOSIS — Z1231 Encounter for screening mammogram for malignant neoplasm of breast: Secondary | ICD-10-CM

## 2022-04-25 DIAGNOSIS — I87391 Chronic venous hypertension (idiopathic) with other complications of right lower extremity: Secondary | ICD-10-CM | POA: Diagnosis not present

## 2022-04-25 DIAGNOSIS — G2581 Restless legs syndrome: Secondary | ICD-10-CM | POA: Diagnosis not present

## 2022-04-25 DIAGNOSIS — I83891 Varicose veins of right lower extremities with other complications: Secondary | ICD-10-CM | POA: Diagnosis not present

## 2022-05-16 DIAGNOSIS — I83891 Varicose veins of right lower extremities with other complications: Secondary | ICD-10-CM | POA: Diagnosis not present

## 2022-05-16 DIAGNOSIS — I87391 Chronic venous hypertension (idiopathic) with other complications of right lower extremity: Secondary | ICD-10-CM | POA: Diagnosis not present

## 2022-06-13 DIAGNOSIS — I87393 Chronic venous hypertension (idiopathic) with other complications of bilateral lower extremity: Secondary | ICD-10-CM | POA: Diagnosis not present

## 2022-06-13 DIAGNOSIS — I83891 Varicose veins of right lower extremities with other complications: Secondary | ICD-10-CM | POA: Diagnosis not present

## 2022-07-03 IMAGING — MG MM DIGITAL SCREENING BILAT W/ TOMO AND CAD
8 series · 8 of 24 positions shown · non-contrast
Comparison: Previous exam(s).

CLINICAL DATA: Screening.

EXAM:
DIGITAL SCREENING BILATERAL MAMMOGRAM WITH TOMOSYNTHESIS AND CAD
TECHNIQUE: Bilateral screening digital craniocaudal and mediolateral oblique
mammograms were obtained. Bilateral screening digital breast
tomosynthesis was performed. The images were evaluated with
computer-aided detection.

[R CC synth-2D]
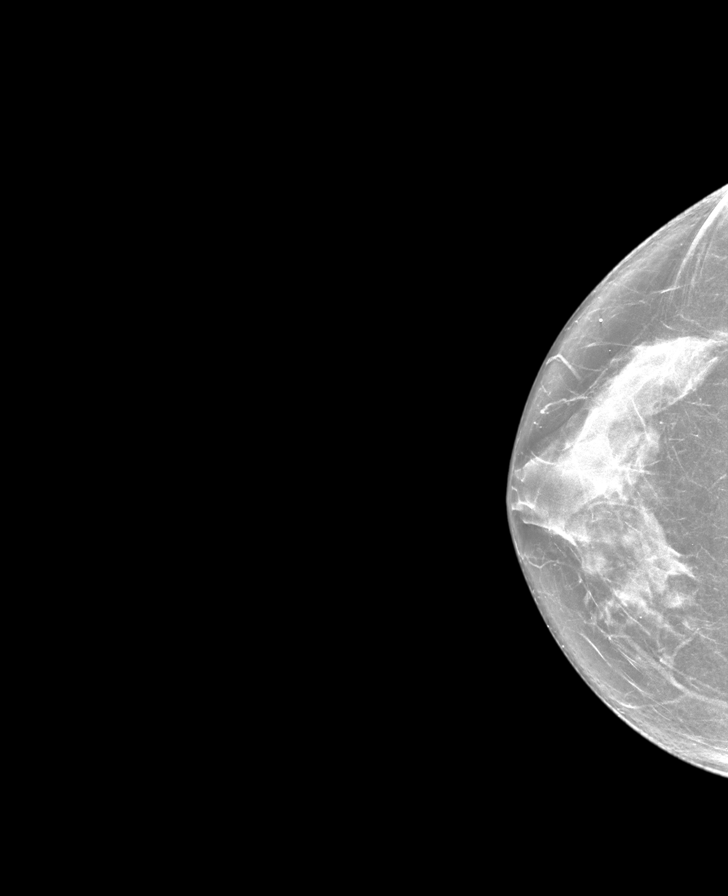

[R MLO synth-2D]
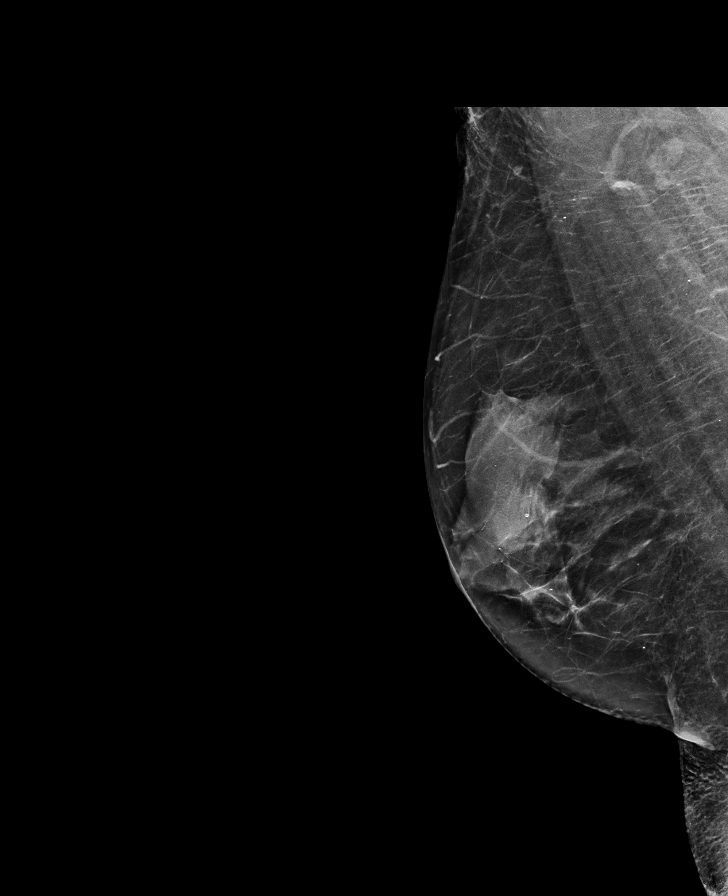

[L MLO synth-2D]
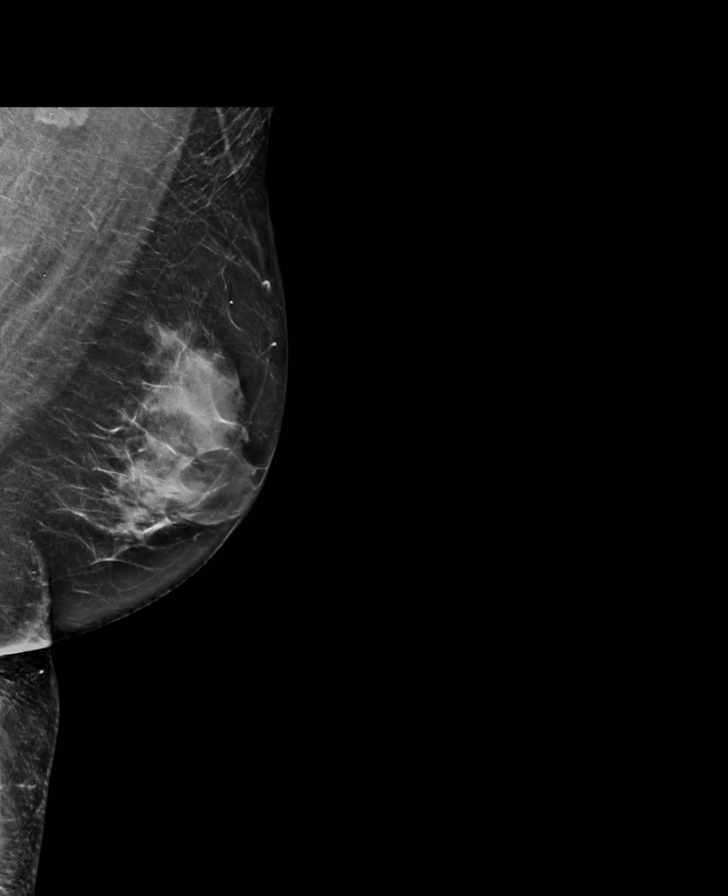

[L CC synth-2D]
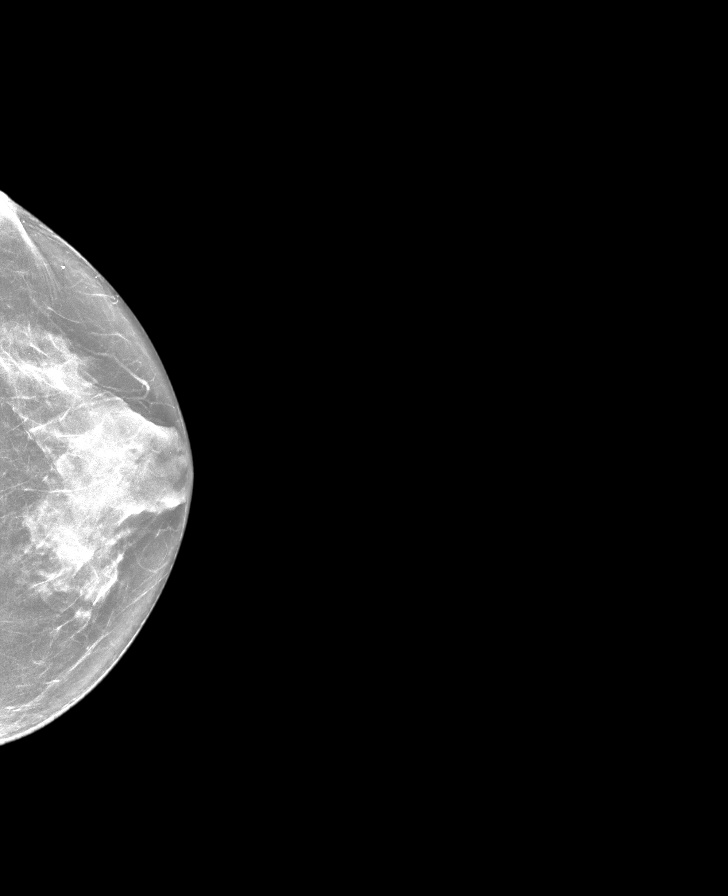

[R CC tomo · tomo slice 33/66.0]
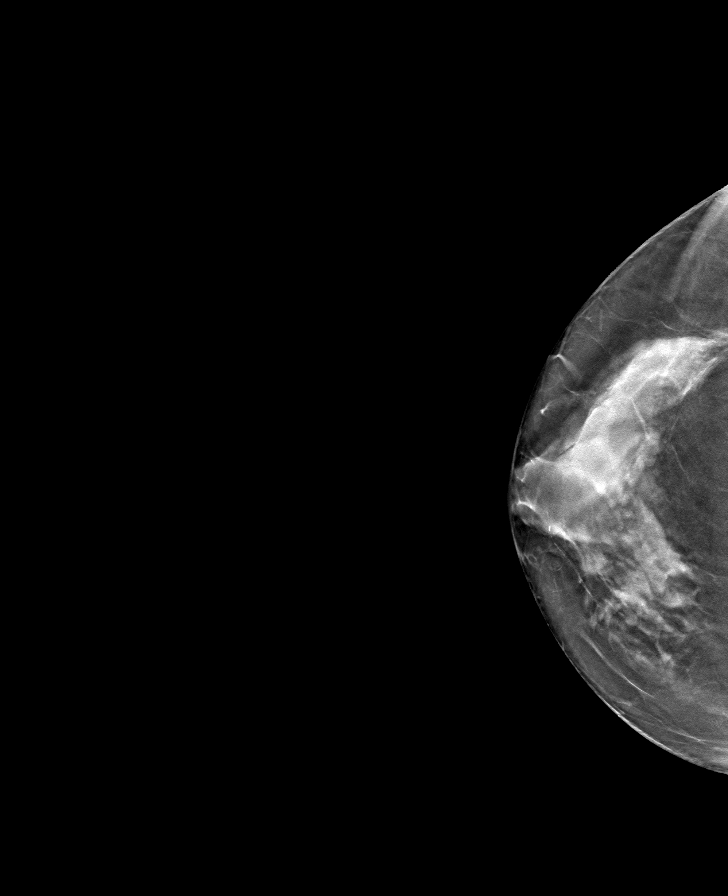

[L CC tomo · tomo slice 31/61.0]
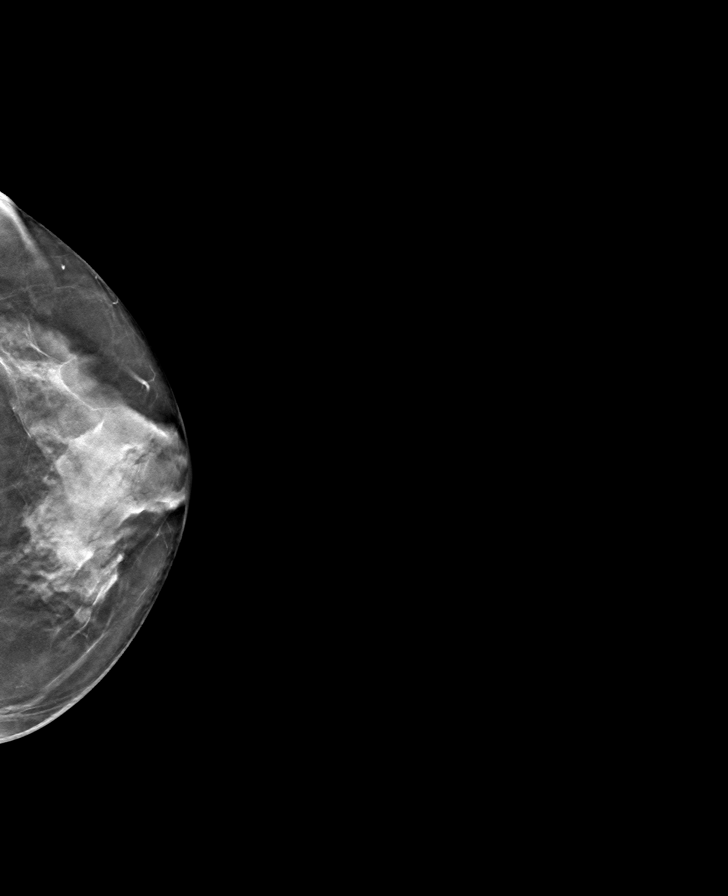

[L MLO tomo · tomo slice 34/67.0]
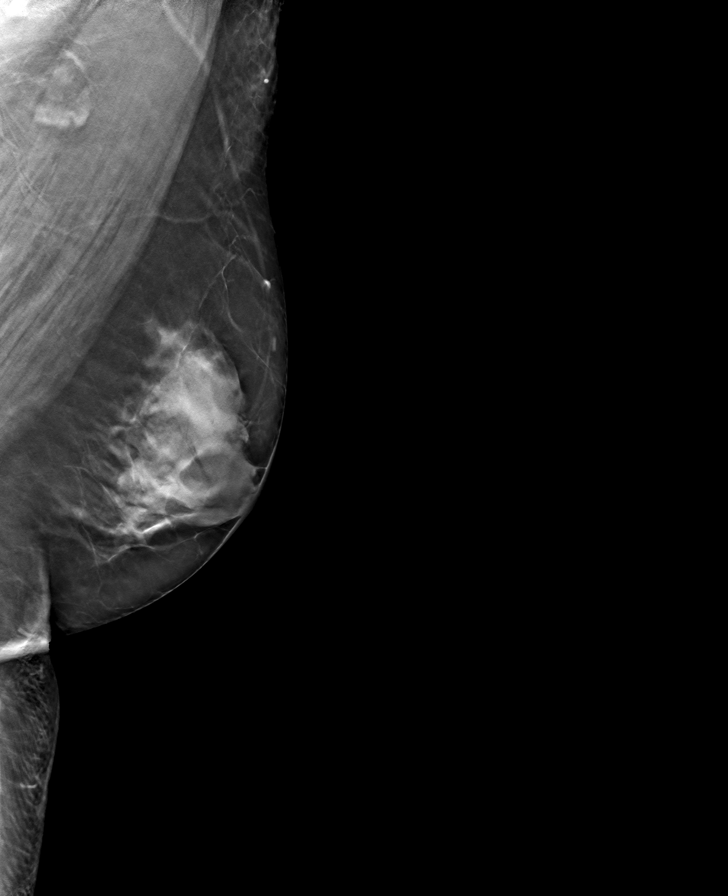

[R MLO tomo · tomo slice 34/67.0]
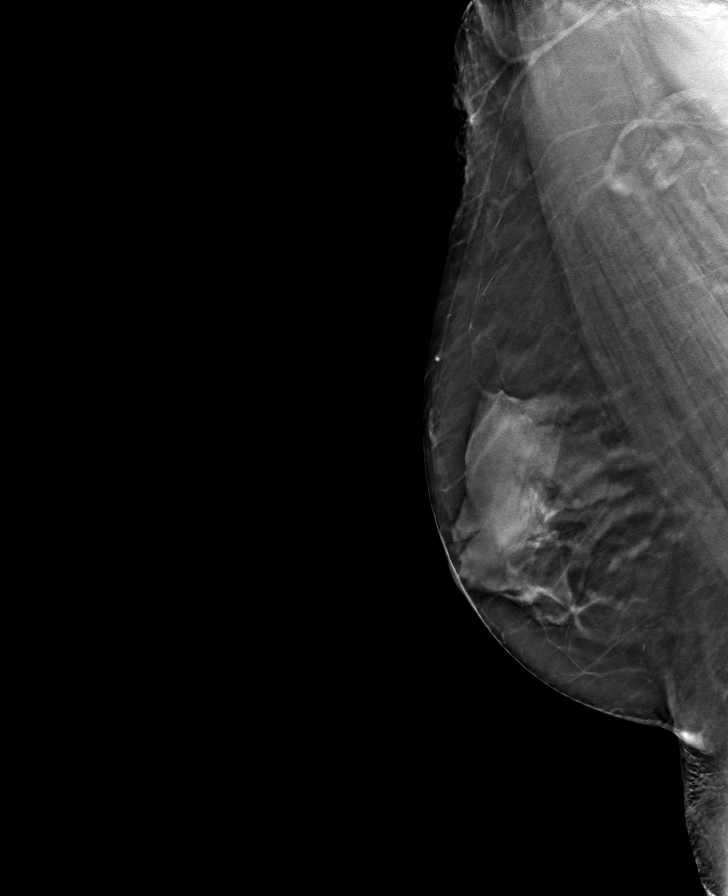

[8 of 24 positions shown; findings below may reference images not displayed]

ACR Breast Density Category c: The breast tissue is heterogeneously
dense, which may obscure small masses.
FINDINGS: There are no findings suspicious for malignancy.
IMPRESSION: No mammographic evidence of malignancy. A result letter of this
screening mammogram will be mailed directly to the patient.

RECOMMENDATION:
Screening mammogram in one year. (Code:[id number]-W-BC3)

BI-RADS CATEGORY  1: Negative.

## 2022-07-13 DIAGNOSIS — I83892 Varicose veins of left lower extremities with other complications: Secondary | ICD-10-CM | POA: Diagnosis not present

## 2022-07-25 DIAGNOSIS — I83812 Varicose veins of left lower extremities with pain: Secondary | ICD-10-CM | POA: Diagnosis not present

## 2022-07-25 DIAGNOSIS — I83892 Varicose veins of left lower extremities with other complications: Secondary | ICD-10-CM | POA: Diagnosis not present

## 2022-08-22 DIAGNOSIS — I83892 Varicose veins of left lower extremities with other complications: Secondary | ICD-10-CM | POA: Diagnosis not present

## 2022-08-22 DIAGNOSIS — I83812 Varicose veins of left lower extremities with pain: Secondary | ICD-10-CM | POA: Diagnosis not present

## 2022-08-22 DIAGNOSIS — M7989 Other specified soft tissue disorders: Secondary | ICD-10-CM | POA: Diagnosis not present

## 2022-09-05 DIAGNOSIS — Z85828 Personal history of other malignant neoplasm of skin: Secondary | ICD-10-CM | POA: Diagnosis not present

## 2022-09-05 DIAGNOSIS — D225 Melanocytic nevi of trunk: Secondary | ICD-10-CM | POA: Diagnosis not present

## 2022-09-05 DIAGNOSIS — D2271 Melanocytic nevi of right lower limb, including hip: Secondary | ICD-10-CM | POA: Diagnosis not present

## 2022-09-05 DIAGNOSIS — L820 Inflamed seborrheic keratosis: Secondary | ICD-10-CM | POA: Diagnosis not present

## 2022-09-05 DIAGNOSIS — D485 Neoplasm of uncertain behavior of skin: Secondary | ICD-10-CM | POA: Diagnosis not present

## 2022-09-05 DIAGNOSIS — L812 Freckles: Secondary | ICD-10-CM | POA: Diagnosis not present

## 2022-09-05 DIAGNOSIS — D1801 Hemangioma of skin and subcutaneous tissue: Secondary | ICD-10-CM | POA: Diagnosis not present

## 2022-09-11 DIAGNOSIS — H5202 Hypermetropia, left eye: Secondary | ICD-10-CM | POA: Diagnosis not present

## 2022-09-11 DIAGNOSIS — H25813 Combined forms of age-related cataract, bilateral: Secondary | ICD-10-CM | POA: Diagnosis not present

## 2022-09-11 DIAGNOSIS — H52201 Unspecified astigmatism, right eye: Secondary | ICD-10-CM | POA: Diagnosis not present

## 2022-11-26 DIAGNOSIS — I872 Venous insufficiency (chronic) (peripheral): Secondary | ICD-10-CM | POA: Diagnosis not present

## 2022-11-26 DIAGNOSIS — I83893 Varicose veins of bilateral lower extremities with other complications: Secondary | ICD-10-CM | POA: Diagnosis not present

## 2022-12-11 DIAGNOSIS — H6121 Impacted cerumen, right ear: Secondary | ICD-10-CM | POA: Diagnosis not present

## 2022-12-11 DIAGNOSIS — Z0000 Encounter for general adult medical examination without abnormal findings: Secondary | ICD-10-CM | POA: Diagnosis not present

## 2022-12-11 DIAGNOSIS — Z0001 Encounter for general adult medical examination with abnormal findings: Secondary | ICD-10-CM | POA: Diagnosis not present

## 2022-12-13 ENCOUNTER — Other Ambulatory Visit: Payer: Self-pay | Admitting: Physician Assistant

## 2022-12-13 DIAGNOSIS — Z1231 Encounter for screening mammogram for malignant neoplasm of breast: Secondary | ICD-10-CM

## 2023-01-31 ENCOUNTER — Ambulatory Visit
Admission: RE | Admit: 2023-01-31 | Discharge: 2023-01-31 | Disposition: A | Discharge status: Home / Self Care | Payer: PPO | Source: Ambulatory Visit | Attending: Physician Assistant | Admitting: Physician Assistant

## 2023-01-31 DIAGNOSIS — Z1231 Encounter for screening mammogram for malignant neoplasm of breast: Secondary | ICD-10-CM | POA: Diagnosis not present

## 2023-09-18 DIAGNOSIS — H524 Presbyopia: Secondary | ICD-10-CM | POA: Diagnosis not present

## 2023-09-18 DIAGNOSIS — H2513 Age-related nuclear cataract, bilateral: Secondary | ICD-10-CM | POA: Diagnosis not present

## 2023-10-10 DIAGNOSIS — D225 Melanocytic nevi of trunk: Secondary | ICD-10-CM | POA: Diagnosis not present

## 2023-10-10 DIAGNOSIS — Z85828 Personal history of other malignant neoplasm of skin: Secondary | ICD-10-CM | POA: Diagnosis not present

## 2023-10-10 DIAGNOSIS — D2262 Melanocytic nevi of left upper limb, including shoulder: Secondary | ICD-10-CM | POA: Diagnosis not present

## 2023-10-10 DIAGNOSIS — L820 Inflamed seborrheic keratosis: Secondary | ICD-10-CM | POA: Diagnosis not present

## 2023-10-10 DIAGNOSIS — D2261 Melanocytic nevi of right upper limb, including shoulder: Secondary | ICD-10-CM | POA: Diagnosis not present

## 2023-10-10 DIAGNOSIS — L821 Other seborrheic keratosis: Secondary | ICD-10-CM | POA: Diagnosis not present

## 2023-10-10 DIAGNOSIS — D1801 Hemangioma of skin and subcutaneous tissue: Secondary | ICD-10-CM | POA: Diagnosis not present

## 2023-10-10 DIAGNOSIS — L812 Freckles: Secondary | ICD-10-CM | POA: Diagnosis not present

## 2023-12-23 ENCOUNTER — Other Ambulatory Visit: Payer: Self-pay | Admitting: Physician Assistant

## 2023-12-23 DIAGNOSIS — Z1231 Encounter for screening mammogram for malignant neoplasm of breast: Secondary | ICD-10-CM

## 2024-01-07 DIAGNOSIS — Z0000 Encounter for general adult medical examination without abnormal findings: Secondary | ICD-10-CM | POA: Diagnosis not present

## 2024-01-07 DIAGNOSIS — Z0001 Encounter for general adult medical examination with abnormal findings: Secondary | ICD-10-CM | POA: Diagnosis not present

## 2024-01-07 DIAGNOSIS — Z13220 Encounter for screening for lipoid disorders: Secondary | ICD-10-CM | POA: Diagnosis not present

## 2024-02-03 ENCOUNTER — Ambulatory Visit
Admission: RE | Admit: 2024-02-03 | Discharge: 2024-02-03 | Disposition: A | Discharge status: Home / Self Care | Payer: PPO | Source: Ambulatory Visit | Attending: Physician Assistant | Admitting: Physician Assistant

## 2024-02-03 DIAGNOSIS — Z1231 Encounter for screening mammogram for malignant neoplasm of breast: Secondary | ICD-10-CM | POA: Diagnosis not present

## 2024-09-23 DIAGNOSIS — H2513 Age-related nuclear cataract, bilateral: Secondary | ICD-10-CM | POA: Diagnosis not present

## 2024-09-23 DIAGNOSIS — H524 Presbyopia: Secondary | ICD-10-CM | POA: Diagnosis not present

## 2024-10-20 DIAGNOSIS — D2271 Melanocytic nevi of right lower limb, including hip: Secondary | ICD-10-CM | POA: Diagnosis not present

## 2024-10-20 DIAGNOSIS — L812 Freckles: Secondary | ICD-10-CM | POA: Diagnosis not present

## 2024-10-20 DIAGNOSIS — D1801 Hemangioma of skin and subcutaneous tissue: Secondary | ICD-10-CM | POA: Diagnosis not present

## 2024-10-20 DIAGNOSIS — Z85828 Personal history of other malignant neoplasm of skin: Secondary | ICD-10-CM | POA: Diagnosis not present

## 2024-10-20 DIAGNOSIS — D225 Melanocytic nevi of trunk: Secondary | ICD-10-CM | POA: Diagnosis not present

## 2024-10-20 DIAGNOSIS — L821 Other seborrheic keratosis: Secondary | ICD-10-CM | POA: Diagnosis not present
